# Patient Record
Sex: Male | Born: 1965 | Race: Black or African American | Hispanic: No | Marital: Single | Smoking: Never smoker
Health system: Southern US, Community
[De-identification: ages and names within clinical notes are randomized; demographics above are authoritative.]

## PROBLEM LIST (undated history)

## (undated) DIAGNOSIS — F5101 Primary insomnia: Secondary | ICD-10-CM

## (undated) DIAGNOSIS — I10 Essential (primary) hypertension: Secondary | ICD-10-CM

## (undated) DIAGNOSIS — E1029 Type 1 diabetes mellitus with other diabetic kidney complication: Secondary | ICD-10-CM

## (undated) DIAGNOSIS — N400 Enlarged prostate without lower urinary tract symptoms: Secondary | ICD-10-CM

## (undated) DIAGNOSIS — I771 Stricture of artery: Secondary | ICD-10-CM

## (undated) DIAGNOSIS — N529 Male erectile dysfunction, unspecified: Secondary | ICD-10-CM

## (undated) DIAGNOSIS — R801 Persistent proteinuria, unspecified: Secondary | ICD-10-CM

## (undated) DIAGNOSIS — G4700 Insomnia, unspecified: Secondary | ICD-10-CM

## (undated) DIAGNOSIS — E782 Mixed hyperlipidemia: Secondary | ICD-10-CM

## (undated) DIAGNOSIS — L989 Disorder of the skin and subcutaneous tissue, unspecified: Secondary | ICD-10-CM

## (undated) HISTORY — DX: Disorder of the skin and subcutaneous tissue, unspecified: L98.9

## (undated) HISTORY — DX: Benign prostatic hyperplasia without lower urinary tract symptoms: N40.0

## (undated) HISTORY — DX: Persistent proteinuria, unspecified: R80.1

## (undated) HISTORY — DX: Stricture of artery: I77.1

## (undated) HISTORY — DX: Essential (primary) hypertension: I10

## (undated) HISTORY — DX: Type 1 diabetes mellitus with other diabetic kidney complication: E10.29

## (undated) HISTORY — DX: Mixed hyperlipidemia: E78.2

## (undated) HISTORY — DX: Male erectile dysfunction, unspecified: N52.9

## (undated) HISTORY — DX: Insomnia, unspecified: G47.00

## (undated) HISTORY — DX: Primary insomnia: F51.01

## (undated) HISTORY — PX: NO PAST SURGERIES: SHX2092

---

## 2005-02-25 ENCOUNTER — Observation Stay (HOSPITAL_COMMUNITY): Admission: EM | Admit: 2005-02-25 | Discharge: 2005-02-26 | Payer: Self-pay | Admitting: Emergency Medicine

## 2010-04-19 ENCOUNTER — Ambulatory Visit: Payer: Self-pay | Admitting: Diagnostic Radiology

## 2010-04-19 ENCOUNTER — Emergency Department (HOSPITAL_BASED_OUTPATIENT_CLINIC_OR_DEPARTMENT_OTHER): Admission: EM | Admit: 2010-04-19 | Discharge: 2010-04-19 | Payer: Self-pay | Admitting: Emergency Medicine

## 2010-08-09 LAB — URINALYSIS, ROUTINE W REFLEX MICROSCOPIC
Glucose, UA: NEGATIVE mg/dL
Nitrite: NEGATIVE
Protein, ur: NEGATIVE mg/dL
Urobilinogen, UA: 0.2 mg/dL (ref 0.0–1.0)

## 2010-08-09 LAB — DIFFERENTIAL
Basophils Relative: 0 % (ref 0–1)
Monocytes Relative: 9 % (ref 3–12)
Neutro Abs: 2.3 10*3/uL (ref 1.7–7.7)
Neutrophils Relative %: 52 % (ref 43–77)

## 2010-08-09 LAB — CBC
HCT: 41.6 % (ref 39.0–52.0)
MCHC: 34.7 g/dL (ref 30.0–36.0)
MCV: 90.1 fL (ref 78.0–100.0)
RDW: 11.2 % — ABNORMAL LOW (ref 11.5–15.5)

## 2010-08-09 LAB — COMPREHENSIVE METABOLIC PANEL
Alkaline Phosphatase: 70 U/L (ref 39–117)
BUN: 15 mg/dL (ref 6–23)
CO2: 27 mEq/L (ref 19–32)
Calcium: 9.3 mg/dL (ref 8.4–10.5)
GFR calc non Af Amer: >60 mL/min (ref >60)
Glucose, Bld: 157 mg/dL — ABNORMAL HIGH (ref 70–99)
Total Protein: 7.2 g/dL (ref 6.0–8.3)

## 2010-08-09 LAB — LIPASE, BLOOD: Lipase: 163 U/L (ref 23–300)

## 2010-08-09 LAB — POCT CARDIAC MARKERS: Troponin i, poc: <0.05 ng/mL (ref 0.00–0.09)

## 2010-10-14 NOTE — Discharge Summary (Signed)
[name], [name]               ACCOUNT NO.:  [id number]   MEDICAL RECORD NO.:  [id number]          PATIENT TYPE:  OBV   LOCATION:  5507                         FACILITY:  [facility]   PHYSICIAN:  Corinna L. Lendell Caprice, MDDATE OF BIRTH:  09-22-1965   DATE OF ADMISSION:  02/25/2005  DATE OF DISCHARGE:                                 DISCHARGE SUMMARY   DIAGNOSES:  1.  Chest pain, most likely costochondritis.  2.  Abnormal EKG.  3.  Diabetes.  4.  Hyperlipidemia   DISCHARGE MEDICATIONS:  1.  Ibuprofen 400 mg p.o. q.4h. p.r.n. pain.  2.  Avandamet 4/500 1 p.o. b.i.d.  3.  Lipitor 40 mg a day.  4.  Flomax 0.4 mg a day.   ACTIVITY:  Ad lib.   CONDITION:  Stable.   FOLLOW UP:  [facility] for consideration of stress test.   PROCEDURES:  None.   CONSULTATIONS:  None.   PERTINENT LABORATORIES:  D-dimer was less than 0.22. CBC unremarkable. BMET  was significant for a glucose of 173, otherwise was unremarkable.  Point of  care enzymes negative. Total CPK was 328, MB fraction 2.2, index 0.7.  Troponins remained 0.02 and below.   EKG SPECIAL STUDIES AND RADIOLOGY:  Chest x-ray negative.  EKG in the walk-  in clinic revealed J-point elevation.  EKG of the emergency room revealed  lateral flipped T-waves which had not been present on the previous EKG.  Follow-up EKG in the morning showed resolution of the flipped T-waves.   HISTORY AND HOSPITAL COURSE:  [name] is a pleasant, 45 year old, black  male patient of Dr. Azucena Cecil who presented to the [facility] with  substernal chest pain, dyspnea on exertion and some heart fluttering.  He  has multiple cardiac risk factors.  Namely, diabetes, hyperlipidemia and  family history of early MI. Please see H&P for complete details. His vital  signs were normal on admission.  He did have some reproducible chest pain  with palpation.  However, as he had some EKG changes, I did place him on  observation in the telemetry unit where he  ruled out for myocardial  infarction.  Toradol promptly relieved the pain. Nitroglycerin did not  relieve the pain. He had also had a lot of recent travel to various states  and D-dimer was ordered but was negative making pulmonary  embolus unlikely. He remained in normal sinus rhythm and had no further  chest pain.  I do feel that this is musculoskeletal; however, given his risk  factors and EKG changes, I do recommend followup with cardiology as an  outpatient for an outpatient stress test.      Corinna L. Lendell Caprice, MD  Electronically Signed     CLS/MEDQ  D:  02/26/2005  T:  02/26/2005  Job:  [id number]   cc:   Tally Joe, M.D.  Fax: [phone]   [facility]

## 2010-10-14 NOTE — H&P (Signed)
NAMECALYN, [name]               ACCOUNT NO.:  [id number]   MEDICAL RECORD NO.:  [id number]          PATIENT TYPE:  EMS   LOCATION:  MAJO                         FACILITY:  [facility]   PHYSICIAN:  Corinna L. Lendell Caprice, MDDATE OF BIRTH:  20-May-1966   DATE OF ADMISSION:  02/25/2005  DATE OF DISCHARGE:                                HISTORY & PHYSICAL   CHIEF COMPLAINT:  Shortness of breath and chest pain.   HISTORY OF PRESENT ILLNESS:  [name] is a pleasant 45 year old black male  who has had chest pain on and off for the past five to six days. He says  exertion makes it worse. It is substernal. It is accompanied by shortness of  breath. It is worsened by deep inspiration. He has had no nausea, vomiting,  dizziness, or cough. He had continued to work up at the gym without  difficulty except that he did not do the elliptical machine as long as  usual. He has taken many recent plane trips, but for two hours at a time, no  longer. He denies any leg pain or swelling. He denies history of coronary  artery disease or blood clot. Resting makes the pain better. It does not  occur at night time. He received nitroglycerin here in the emergency room  without much relief.  His cardiac risk factors are positive family history  for early MI, hyperlipidemia, and diabetes.   PAST MEDICAL HISTORY:  1.  Diabetes.  2.  Hyperlipidemia.  3.  Urinary hesitancy for which he is currently on Flomax.   MEDICATIONS:  1.  Avandamet 4/500 mg p.o. b.i.d.  2.  Lipitor 40 mg daily.  3.  Flomax 0.4 mg daily.   ALLERGIES:  No known drug allergies.   SOCIAL HISTORY:  He is married. He does not smoke or drink.   FAMILY HISTORY:  His father had his heart attack in his late 38s or early  17s. His father also had hypertension, diabetes, and hyperlipidemia.   REVIEW OF SYSTEMS:  As above. Otherwise, negative.   PHYSICAL EXAMINATION:  VITAL SIGNS: Temperature 96.5, blood pressure 133/77,  pulse 80, respiratory  rate 18, oxygen saturation 99% on room air.  GENERAL: The patient is well-nourished, well-developed, and in no acute  distress.  HEENT: Normocephalic and atraumatic. Pupils equal, round, and reactive to  light. Sclera nonicteric. Moist mucous membranes.  NECK: Supple with no thyromegaly, no carotid bruits.  LUNGS: Clear to auscultation bilaterally, without rales, rhonchi, or  wheezes.  CARDIOVASCULAR: Regular rate and rhythm without murmurs, rubs, or gallops.  He does have a reproducible component of the chest pain with palpation.  ABDOMEN: Soft, nontender, nondistended.  GU/RECTAL: Deferred.  EXTREMITIES: No clubbing, cyanosis, or edema.  SKIN: No rash.  PSYCHIATRIC: Normal affect.   LABORATORY DATA:  His first set of point of care enzymes are normal with  creatinine of 1.3, glucose 173, BUN 16. Sodium 137, potassium 4.0, chloride  104. Hemoglobin and hematocrit are normal.   Chest x-ray negative. E[facility] done at [facility] shows  normal sinus rhythm with right atrial enlargement  and what looks like J-  point elevation in leads V2 through V4 and a small amount of ST depression  or flipped T wave in lead III. EKG done in the emergency room shows normal  sinus rhythm with flipped T waves laterally which was not present upon the  first EKG.   ASSESSMENT/PLAN:  1.  Chest pain: This seems to be more musculoskeletal. However, the patient      has significant cardiac risk factors and does have EKG changes.      Therefore, he will be placed on observation telemetry unit and have an      MI ruled out. Also given his recent travel and the somewhat worsening of      the pain with shortness of breath, I will also get a D-dimer to evaluate      for pulmonary embolus. If this is high he will need a CT of the chest. I      will continue his aspirin. I will try a dose of Toradol.  2.  Diabetes. I will continue his outpatient medications and monitor blood      glucose.  3.   Hyperlipidemia. Continue Lipitor.      Corinna L. Lendell Caprice, MD  Electronically Signed     CLS/MEDQ  D:  02/25/2005  T:  02/25/2005  Job:  [id number]   cc:   Tally Joe, M.D.  Fax: [phone]

## 2012-07-26 ENCOUNTER — Encounter: Payer: Self-pay | Admitting: Internal Medicine

## 2012-12-01 ENCOUNTER — Emergency Department (HOSPITAL_BASED_OUTPATIENT_CLINIC_OR_DEPARTMENT_OTHER)
Admission: EM | Admit: 2012-12-01 | Discharge: 2012-12-02 | Disposition: A | Discharge status: Home / Self Care | Payer: BC Managed Care – PPO | Attending: Emergency Medicine | Admitting: Emergency Medicine

## 2012-12-01 ENCOUNTER — Other Ambulatory Visit: Payer: Self-pay

## 2012-12-01 ENCOUNTER — Emergency Department (HOSPITAL_BASED_OUTPATIENT_CLINIC_OR_DEPARTMENT_OTHER): Payer: BC Managed Care – PPO

## 2012-12-01 ENCOUNTER — Encounter (HOSPITAL_BASED_OUTPATIENT_CLINIC_OR_DEPARTMENT_OTHER): Payer: Self-pay | Admitting: *Deleted

## 2012-12-01 DIAGNOSIS — R0789 Other chest pain: Secondary | ICD-10-CM

## 2012-12-01 DIAGNOSIS — Z794 Long term (current) use of insulin: Secondary | ICD-10-CM | POA: Insufficient documentation

## 2012-12-01 DIAGNOSIS — E119 Type 2 diabetes mellitus without complications: Secondary | ICD-10-CM | POA: Insufficient documentation

## 2012-12-01 DIAGNOSIS — Z79899 Other long term (current) drug therapy: Secondary | ICD-10-CM | POA: Insufficient documentation

## 2012-12-01 DIAGNOSIS — R071 Chest pain on breathing: Secondary | ICD-10-CM | POA: Insufficient documentation

## 2012-12-01 DIAGNOSIS — R110 Nausea: Secondary | ICD-10-CM | POA: Insufficient documentation

## 2012-12-01 DIAGNOSIS — Z7982 Long term (current) use of aspirin: Secondary | ICD-10-CM | POA: Insufficient documentation

## 2012-12-01 MED ORDER — NITROGLYCERIN 0.4 MG SL SUBL
0.4000 mg | SUBLINGUAL_TABLET | Freq: Once | SUBLINGUAL | Status: AC
  Administered 2012-12-01: 0.4 mg via SUBLINGUAL
  Filled 2012-12-01: qty 25

## 2012-12-01 NOTE — ED Notes (Signed)
Pt states he lifted weights on Friday. Yesterday began having left side CP. +nausea today. Describes as stabbing. EKG done at triage. Took Oxycodone 30 min ago.

## 2012-12-01 NOTE — ED Provider Notes (Signed)
History    CSN: [id number] Arrival date & time 12/01/12  2202  First MD Initiated Contact with Patient 12/01/12 2221     Chief Complaint  Patient presents with  . Chest Pain   (Consider location/radiation/quality/duration/timing/severity/associated sxs/prior Treatment) Patient is a 47 y.o. male presenting with chest pain. The history is provided by the patient. No language interpreter was used.  Chest Pain Pain location:  L chest Associated symptoms: nausea   Associated symptoms: no fever   Associated symptoms comment:  He reports left sided chest pain that started earlier today, associated with nausea. Pain has been fairly constant, some waxing and waning. Worse with certain movements but present at rest. He lifted weights a couple of days ago and felt this was cause of pain. No history of heart conditions.   Past Medical History  Diagnosis Date  . Diabetes mellitus without complication    History reviewed. No pertinent past surgical history. History reviewed. No pertinent family history. History  Substance Use Topics  . Smoking status: Never Smoker   . Smokeless tobacco: Not on file  . Alcohol Use: Yes    Review of Systems  Constitutional: Negative for fever and chills.  Respiratory: Negative.   Cardiovascular: Positive for chest pain. Negative for leg swelling.  Gastrointestinal: Positive for nausea.  Musculoskeletal: Negative.  Negative for myalgias.  Skin: Negative.   Neurological: Negative.     Allergies  Review of patient's allergies indicates no known allergies.  Home Medications   Current Outpatient Rx  Name  Route  Sig  Dispense  Refill  . aspirin 81 MG tablet   Oral   Take 81 mg by mouth daily.         . insulin glargine (LANTUS) 100 UNIT/ML injection   Subcutaneous   Inject 80 Units into the skin at bedtime.         . insulin lispro (HUMALOG) 100 UNIT/ML injection   Subcutaneous   Inject 45 Units into the skin 3 (three) times daily before  meals.         . ramipril (ALTACE) 2.5 MG capsule   Oral   Take 2.5 mg by mouth daily.          BP 154/89  Pulse 84  Temp(Src) 98.5 F (36.9 C) (Oral)  Resp 20  Ht 5\' 11"  (1.803 m)  Wt 207 lb (93.895 kg)  BMI 28.88 kg/m2  SpO2 98% Physical Exam  Constitutional: He appears well-developed and well-nourished.  HENT:  Head: Normocephalic.  Neck: Normal range of motion. Neck supple.  Cardiovascular: Normal rate and regular rhythm.   Pulmonary/Chest: Effort normal and breath sounds normal.  Abdominal: Soft. Bowel sounds are normal. There is no tenderness. There is no rebound and no guarding.  Musculoskeletal: Normal range of motion.  Neurological: He is alert. No cranial nerve deficit.  Skin: Skin is warm and dry. No rash noted.  Psychiatric: He has a normal mood and affect.    ED Course  Procedures (including critical care time) Labs Reviewed  CBC WITH DIFFERENTIAL  BASIC METABOLIC PANEL  TROPONIN I   Date: 12/01/2012  Rate: 83  Rhythm: normal sinus rhythm  QRS Axis: normal  Intervals: normal  ST/T Wave abnormalities: nonspecific T wave changes  Conduction Disutrbances:none  Narrative Interpretation:   Old EKG Reviewed: none available Results for orders placed during the hospital encounter of 12/01/12  CBC WITH DIFFERENTIAL      Result Value Range   WBC 7.0  4.0 - 10.5  K/uL   RBC 4.77  4.22 - 5.81 MIL/uL   Hemoglobin 15.1  13.0 - 17.0 g/dL   HCT 16.1  09.6 - 04.5 %   MCV 88.1  78.0 - 100.0 fL   MCH 31.7  26.0 - 34.0 pg   MCHC 36.0  30.0 - 36.0 g/dL   RDW 40.9  81.1 - 91.4 %   Platelets 236  150 - 400 K/uL   Neutrophils Relative % 44  43 - 77 %   Neutro Abs 3.1  1.7 - 7.7 K/uL   Lymphocytes Relative 43  12 - 46 %   Lymphs Abs 3.0  0.7 - 4.0 K/uL   Monocytes Relative 11  3 - 12 %   Monocytes Absolute 0.8  0.1 - 1.0 K/uL   Eosinophils Relative 2  0 - 5 %   Eosinophils Absolute 0.1  0.0 - 0.7 K/uL   Basophils Relative 0  0 - 1 %   Basophils Absolute 0.0   0.0 - 0.1 K/uL  BASIC METABOLIC PANEL      Result Value Range   Sodium 137  135 - 145 mEq/L   Potassium 3.7  3.5 - 5.1 mEq/L   Chloride 101  96 - 112 mEq/L   CO2 27  19 - 32 mEq/L   Glucose, Bld 192 (*) 70 - 99 mg/dL   BUN 24 (*) 6 - 23 mg/dL   Creatinine, Ser 7.82 (*) 0.50 - 1.35 mg/dL   Calcium 95.6  8.4 - 21.3 mg/dL   GFR calc non Af Amer 53 (*) >90 mL/min   GFR calc Af Amer 62 (*) >90 mL/min  TROPONIN I      Result Value Range   Troponin I <0.30  <0.30 ng/mL     Dg Chest 2 View  12/01/2012   *RADIOLOGY REPORT*  Clinical Data: Chest pain.  CHEST - 2 VIEW  Comparison: 04/19/2010  Findings: Heart is normal size.  Linear densities in the left base compatible with atelectasis.  Right lung is clear.  No effusions or bony abnormality.  IMPRESSION: Left base atelectasis.   Original Report Authenticated By: Charlett Nose, M.D.   No diagnosis found. 1. Chest pain MDM  Patient care turned over to Dr. Bernette Mayers pending labs.    Arnoldo Hooker, PA-C 12/09/12 [phone]

## 2012-12-02 LAB — CBC WITH DIFFERENTIAL/PLATELET
Basophils Absolute: 0 10*3/uL (ref 0.0–0.1)
Basophils Relative: 0 % (ref 0–1)
Eosinophils Absolute: 0.1 10*3/uL (ref 0.0–0.7)
Eosinophils Relative: 2 % (ref 0–5)
HCT: 42 % (ref 39.0–52.0)
Hemoglobin: 15.1 g/dL (ref 13.0–17.0)
MCH: 31.7 pg (ref 26.0–34.0)
MCHC: 36 g/dL (ref 30.0–36.0)
MCV: 88.1 fL (ref 78.0–100.0)
Monocytes Absolute: 0.8 10*3/uL (ref 0.1–1.0)
Monocytes Relative: 11 % (ref 3–12)
RDW: 11.7 % (ref 11.5–15.5)

## 2012-12-02 LAB — BASIC METABOLIC PANEL
BUN: 24 mg/dL — ABNORMAL HIGH (ref 6–23)
Calcium: 10.3 mg/dL (ref 8.4–10.5)
Creatinine, Ser: 1.5 mg/dL — ABNORMAL HIGH (ref 0.50–1.35)
GFR calc Af Amer: 62 mL/min — ABNORMAL LOW (ref >90)

## 2012-12-02 LAB — TROPONIN I: Troponin I: <0.3 ng/mL (ref <0.30)

## 2012-12-02 MED ORDER — IBUPROFEN 800 MG PO TABS: 800.0000 mg | ORAL_TABLET | Freq: Three times a day (TID) | ORAL | Status: DC

## 2012-12-02 MED ORDER — CYCLOBENZAPRINE HCL 10 MG PO TABS: 10.0000 mg | ORAL_TABLET | Freq: Three times a day (TID) | ORAL | Status: DC | PRN

## 2012-12-02 MED ORDER — KETOROLAC TROMETHAMINE 30 MG/ML IJ SOLN
30.0000 mg | Freq: Once | INTRAMUSCULAR | Status: AC
  Administered 2012-12-02: 30 mg via INTRAVENOUS
  Filled 2012-12-02: qty 1

## 2012-12-02 NOTE — ED Provider Notes (Signed)
Care assumed at the change of shift. Medical screening examination/treatment/procedure(s) were conducted as a shared visit with non-physician practitioner(s) and myself.  I personally evaluated the patient during the encounter  Pt with reproducible MSK chest pain after heavy lifting, ongoing for >24hrs with negative Trop and no ischemic changes on EKG. Doubt this is cardiac or pulmonary etiology. Advised to rest and avoid heavy lifting for a few days. PCP followup. Advised to return to the ED for any worsening pain, SOB or for any other concerns.   Charles B. Bernette Mayers, MD 12/02/12 1610

## 2012-12-09 NOTE — ED Provider Notes (Signed)
Medical screening examination/treatment/procedure(s) were performed by non-physician practitioner and as supervising physician I was immediately available for consultation/collaboration.  Geoffery Lyons, MD 12/09/12 [phone]

## 2015-06-02 ENCOUNTER — Encounter (HOSPITAL_BASED_OUTPATIENT_CLINIC_OR_DEPARTMENT_OTHER): Payer: Self-pay | Admitting: Emergency Medicine

## 2015-06-02 ENCOUNTER — Emergency Department (HOSPITAL_BASED_OUTPATIENT_CLINIC_OR_DEPARTMENT_OTHER)
Admission: EM | Admit: 2015-06-02 | Discharge: 2015-06-02 | Disposition: A | Discharge status: Home / Self Care | Payer: BLUE CROSS/BLUE SHIELD | Attending: Emergency Medicine | Admitting: Emergency Medicine

## 2015-06-02 ENCOUNTER — Emergency Department (HOSPITAL_BASED_OUTPATIENT_CLINIC_OR_DEPARTMENT_OTHER): Payer: BLUE CROSS/BLUE SHIELD

## 2015-06-02 DIAGNOSIS — Z7982 Long term (current) use of aspirin: Secondary | ICD-10-CM | POA: Diagnosis not present

## 2015-06-02 DIAGNOSIS — R5383 Other fatigue: Secondary | ICD-10-CM | POA: Diagnosis not present

## 2015-06-02 DIAGNOSIS — E119 Type 2 diabetes mellitus without complications: Secondary | ICD-10-CM | POA: Insufficient documentation

## 2015-06-02 DIAGNOSIS — Z794 Long term (current) use of insulin: Secondary | ICD-10-CM | POA: Diagnosis not present

## 2015-06-02 DIAGNOSIS — R0602 Shortness of breath: Secondary | ICD-10-CM | POA: Insufficient documentation

## 2015-06-02 DIAGNOSIS — R079 Chest pain, unspecified: Secondary | ICD-10-CM

## 2015-06-02 LAB — CBC
HEMATOCRIT: 46.4 % (ref 39.0–52.0)
HEMOGLOBIN: 16.3 g/dL (ref 13.0–17.0)
MCH: 30.9 pg (ref 26.0–34.0)
MCHC: 35.1 g/dL (ref 30.0–36.0)
MCV: 87.9 fL (ref 78.0–100.0)
Platelets: 258 10*3/uL (ref 150–400)
RBC: 5.28 MIL/uL (ref 4.22–5.81)
RDW: 11.2 % — ABNORMAL LOW (ref 11.5–15.5)
WBC: 5.3 10*3/uL (ref 4.0–10.5)

## 2015-06-02 LAB — TROPONIN I
Troponin I: <0.03 ng/mL (ref <0.031)
Troponin I: <0.03 ng/mL (ref <0.031)

## 2015-06-02 LAB — BASIC METABOLIC PANEL
ANION GAP: 7 (ref 5–15)
BUN: 25 mg/dL — ABNORMAL HIGH (ref 6–20)
CALCIUM: 9.4 mg/dL (ref 8.9–10.3)
CO2: 28 mmol/L (ref 22–32)
Chloride: 101 mmol/L (ref 101–111)
Creatinine, Ser: 1.37 mg/dL — ABNORMAL HIGH (ref 0.61–1.24)
GFR, EST NON AFRICAN AMERICAN: 59 mL/min — AB (ref >60)
Glucose, Bld: 168 mg/dL — ABNORMAL HIGH (ref 65–99)
POTASSIUM: 4.1 mmol/L (ref 3.5–5.1)
SODIUM: 136 mmol/L (ref 135–145)

## 2015-06-02 LAB — D-DIMER, QUANTITATIVE: D-Dimer, Quant: 0.29 ug/mL-FEU (ref 0.00–0.50)

## 2015-06-02 NOTE — Discharge Instructions (Signed)
Nonspecific Chest Pain  °Chest pain can be caused by many different conditions. There is always a chance that your pain could be related to something serious, such as a heart attack or a blood clot in your lungs. Chest pain can also be caused by conditions that are not life-threatening. If you have chest pain, it is very important to follow up with your health care provider. °CAUSES  °Chest pain can be caused by: °· Heartburn. °· Pneumonia or bronchitis. °· Anxiety or stress. °· Inflammation around your heart (pericarditis) or lung (pleuritis or pleurisy). °· A blood clot in your lung. °· A collapsed lung (pneumothorax). It can develop suddenly on its own (spontaneous pneumothorax) or from trauma to the chest. °· Shingles infection (varicella-zoster virus). °· Heart attack. °· Damage to the bones, muscles, and cartilage that make up your chest wall. This can include: °¨ Bruised bones due to injury. °¨ Strained muscles or cartilage due to frequent or repeated coughing or overwork. °¨ Fracture to one or more ribs. °¨ Sore cartilage due to inflammation (costochondritis). °RISK FACTORS  °Risk factors for chest pain may include: °· Activities that increase your risk for trauma or injury to your chest. °· Respiratory infections or conditions that cause frequent coughing. °· Medical conditions or overeating that can cause heartburn. °· Heart disease or family history of heart disease. °· Conditions or health behaviors that increase your risk of developing a blood clot. °· Having had chicken pox (varicella zoster). °SIGNS AND SYMPTOMS °Chest pain can feel like: °· Burning or tingling on the surface of your chest or deep in your chest. °· Crushing, pressure, aching, or squeezing pain. °· Dull or sharp pain that is worse when you move, cough, or take a deep breath. °· Pain that is also felt in your back, neck, shoulder, or arm, or pain that spreads to any of these areas. °Your chest pain may come and go, or it may stay  constant. °DIAGNOSIS °Lab tests or other studies may be needed to find the cause of your pain. Your health care provider may have you take a test called an ambulatory ECG (electrocardiogram). An ECG records your heartbeat patterns at the time the test is performed. You may also have other tests, such as: °· Transthoracic echocardiogram (TTE). During echocardiography, sound waves are used to create a picture of all of the heart structures and to look at how blood flows through your heart. °· Transesophageal echocardiogram (TEE). This is a more advanced imaging test that obtains images from inside your body. It allows your health care provider to see your heart in finer detail. °· Cardiac monitoring. This allows your health care provider to monitor your heart rate and rhythm in real time. °· Holter monitor. This is a portable device that records your heartbeat and can help to diagnose abnormal heartbeats. It allows your health care provider to track your heart activity for several days, if needed. °· Stress tests. These can be done through exercise or by taking medicine that makes your heart beat more quickly. °· Blood tests. °· Imaging tests. °TREATMENT  °Your treatment depends on what is causing your chest pain. Treatment may include: °· Medicines. These may include: °¨ Acid blockers for heartburn. °¨ Anti-inflammatory medicine. °¨ Pain medicine for inflammatory conditions. °¨ Antibiotic medicine, if an infection is present. °¨ Medicines to dissolve blood clots. °¨ Medicines to treat coronary artery disease. °· Supportive care for conditions that do not require medicines. This may include: °¨ Resting. °¨ Applying heat   or cold packs to injured areas. °¨ Limiting activities until pain decreases. °HOME CARE INSTRUCTIONS °· If you were prescribed an antibiotic medicine, finish it all even if you start to feel better. °· Avoid any activities that bring on chest pain. °· Do not use any tobacco products, including  cigarettes, chewing tobacco, or electronic cigarettes. If you need help quitting, ask your health care provider. °· Do not drink alcohol. °· Take medicines only as directed by your health care provider. °· Keep all follow-up visits as directed by your health care provider. This is important. This includes any further testing if your chest pain does not go away. °· If heartburn is the cause for your chest pain, you may be told to keep your head raised (elevated) while sleeping. This reduces the chance that acid will go from your stomach into your esophagus. °· Make lifestyle changes as directed by your health care provider. These may include: °¨ Getting regular exercise. Ask your health care provider to suggest some activities that are safe for you. °¨ Eating a heart-healthy diet. A registered dietitian can help you to learn healthy eating options. °¨ Maintaining a healthy weight. °¨ Managing diabetes, if necessary. °¨ Reducing stress. °SEEK MEDICAL CARE IF: °· Your chest pain does not go away after treatment. °· You have a rash with blisters on your chest. °· You have a fever. °SEEK IMMEDIATE MEDICAL CARE IF:  °· Your chest pain is worse. °· You have an increasing cough, or you cough up blood. °· You have severe abdominal pain. °· You have severe weakness. °· You faint. °· You have chills. °· You have sudden, unexplained chest discomfort. °· You have sudden, unexplained discomfort in your arms, back, neck, or jaw. °· You have shortness of breath at any time. °· You suddenly start to sweat, or your skin gets clammy. °· You feel nauseous or you vomit. °· You suddenly feel light-headed or dizzy. °· Your heart begins to beat quickly, or it feels like it is skipping beats. °These symptoms may represent a serious problem that is an emergency. Do not wait to see if the symptoms will go away. Get medical help right away. Call your local emergency services (911 in the [place] Do not drive yourself to the hospital. °  °This  information is not intended to replace advice given to you by your health care provider. Make sure you discuss any questions you have with your health care provider. °  °Document Released: 02/22/2005 Document Revised: 06/05/2014 Document Reviewed: 12/19/2013 °Elsevier Interactive Patient Education ©2016 [employer] ° °

## 2015-06-02 NOTE — ED Notes (Signed)
Patient reports that he has been fatigued and having sharp intermittent pains to his left chest over the last 2 -3 days. The patient reports that his chest has been more and more "heavy" today

## 2015-06-02 NOTE — ED Provider Notes (Signed)
CSN: [id number]     Arrival date & time 06/02/15  1720 History  By signing my name below, I, Jarvis Morganaylor Ferguson, attest that this documentation has been prepared under the direction and in the presence of No att. providers found. Electronically Signed: Jarvis Morganaylor Ferguson, ED Scribe. 06/03/2015. 12:35 AM.    Chief Complaint  Patient presents with  . Chest Pain    The history is provided by the patient. No language interpreter was used.    HPI Comments: Rodney BoozeDonovan Eastham is a 50 y.o. male with a h/o DM who presents to the Emergency Department complaining of sudden onset, intermittent, moderate, sharp and pressured, non-exertional, left sided chest pain for 2-3 days. He notes associated mild SOB and fatigue.  Pt endorses he travels a lot for work. He has not taken any medications prior to arrival. Pt notes that his BS levels have been over 300. He reports that early onset cardiac disease runs in his family. He had a stress test 10 years ago which came back normal. Pt notes he has a h/o costochondritis but this feels different. Pt is a non smoker. He denies any leg swelling, cough, wheezing, or other respiratory symptoms.  Past Medical History  Diagnosis Date  . Diabetes mellitus without complication (HCC)    History reviewed. No pertinent past surgical history. History reviewed. No pertinent family history. Social History  Substance Use Topics  . Smoking status: Never Smoker   . Smokeless tobacco: None  . Alcohol Use: Yes    Review of Systems  Constitutional: Positive for fatigue.  Respiratory: Positive for shortness of breath. Negative for cough, chest tightness and wheezing.   Cardiovascular: Positive for chest pain. Negative for leg swelling.      Allergies  Review of patient's allergies indicates no known allergies.  Home Medications   Prior to Admission medications   Medication Sig Start Date End Date Taking? Authorizing Provider  aspirin 81 MG tablet Take 81 mg by mouth daily.     Historical Provider, MD  cyclobenzaprine (FLEXERIL) 10 MG tablet Take 1 tablet (10 mg total) by mouth 3 (three) times daily as needed for muscle spasms. 12/02/12   Susy Frizzleharles [place], MD  ibuprofen (ADVIL,MOTRIN) 800 MG tablet Take 1 tablet (800 mg total) by mouth 3 (three) times daily. 12/02/12   Susy Frizzleharles [place], MD  insulin glargine (LANTUS) 100 UNIT/ML injection Inject 80 Units into the skin at bedtime.    Historical Provider, MD  insulin lispro (HUMALOG) 100 UNIT/ML injection Inject 45 Units into the skin 3 (three) times daily before meals.    Historical Provider, MD  ramipril (ALTACE) 2.5 MG capsule Take 2.5 mg by mouth daily.    Historical Provider, MD   Triage Vitals: BP 161/99 mmHg  Pulse 69  Temp(Src) 98.1 F (36.7 C) (Oral)  Resp 16  Ht 5\' 11"  (1.803 m)  Wt 210 lb (95.255 kg)  BMI 29.30 kg/m2  SpO2 98%  Physical Exam  Constitutional: He is oriented to person, place, and time. He appears well-developed and well-nourished. No distress.  HENT:  Head: Normocephalic and atraumatic.  Eyes: Conjunctivae and EOM are normal.  Neck: Neck supple. No tracheal deviation present.  Cardiovascular: Normal rate, regular rhythm and normal heart sounds.   No murmur heard. No lower extremity edema  Pulmonary/Chest: Effort normal and breath sounds normal. No respiratory distress. He has no wheezes.  Left parasternal tenderness  Musculoskeletal: Normal range of motion.  Neurological: He is alert and oriented to person, place, and time.  Skin: Skin is warm and dry. No rash noted.  Psychiatric: He has a normal mood and affect. His behavior is normal.  Nursing note and vitals reviewed.   ED Course  Procedures (including critical care time)   Results for orders placed or performed during the hospital encounter of 06/02/15  Basic metabolic panel  Result Value Ref Range   Sodium 136 135 - 145 mmol/L   Potassium 4.1 3.5 - 5.1 mmol/L   Chloride 101 101 - 111 mmol/L   CO2 28 22 - 32 mmol/L    Glucose, Bld 168 (H) 65 - 99 mg/dL   BUN 25 (H) 6 - 20 mg/dL   Creatinine, Ser 8.11 (H) 0.61 - 1.24 mg/dL   Calcium 9.4 8.9 - 91.4 mg/dL   GFR calc non Af Amer 59 (L) >60 mL/min   GFR calc Af Amer >60 >60 mL/min   Anion gap 7 5 - 15  CBC  Result Value Ref Range   WBC 5.3 4.0 - 10.5 K/uL   RBC 5.28 4.22 - 5.81 MIL/uL   Hemoglobin 16.3 13.0 - 17.0 g/dL   HCT 78.2 95.6 - 21.3 %   MCV 87.9 78.0 - 100.0 fL   MCH 30.9 26.0 - 34.0 pg   MCHC 35.1 30.0 - 36.0 g/dL   RDW 08.6 (L) 57.8 - 46.9 %   Platelets 258 150 - 400 K/uL  Troponin I  Result Value Ref Range   Troponin I <0.03 <0.031 ng/mL  D-dimer, quantitative (not at [facility])  Result Value Ref Range   D-Dimer, Quant 0.29 0.00 - 0.50 ug/mL-FEU  Troponin I  Result Value Ref Range   Troponin I <0.03 <0.031 ng/mL   Dg Chest 2 View  06/02/2015  CLINICAL DATA:  Chest tightness and some shortness of breath for 3 days. EXAM: CHEST  2 VIEW COMPARISON:  None. FINDINGS: Normal mediastinum and cardiac silhouette. Normal pulmonary vasculature. No evidence of effusion, infiltrate, or pneumothorax. No acute bony abnormality. IMPRESSION: No acute cardiopulmonary process. Electronically Signed   By: Genevive Bi M.D.   On: 06/02/2015 17:54   I have personally reviewed and evaluated these images and lab results as part of my medical decision-making.   EKG Interpretation   Date/Time:  Wednesday June 02 2015 17:24:27 EST Ventricular Rate:  75 PR Interval:  128 QRS Duration: 84 QT Interval:  372 QTC Calculation: 415 R Axis:   49 Text Interpretation:  Normal sinus rhythm Biatrial enlargement Nonspecific  T wave abnormality Abnormal ECG nonspecific inferior ST  changes Confirmed  by Rubin Payor  MD, Harrold Donath [phone]) on 06/02/2015 6:27:48 PM      MDM   Final diagnoses:  Chest pain, unspecified chest pain type    Patient with chest pain. EKG reassuring. D-dimer and troponin negative. Nonspecific EKG changes. Will discharge home. Will follow-up  for further outpatient workup. I personally performed the services described in this documentation, which was scribed in my presence. The recorded information has been reviewed and is accurate.       Benjiman Core, MD 06/03/15 [phone]

## 2015-06-02 NOTE — ED Notes (Signed)
MD at bedside discussing test results and dispo plan of care. 

## 2015-07-05 DIAGNOSIS — E782 Mixed hyperlipidemia: Secondary | ICD-10-CM | POA: Insufficient documentation

## 2015-07-05 DIAGNOSIS — R801 Persistent proteinuria, unspecified: Secondary | ICD-10-CM | POA: Insufficient documentation

## 2015-07-05 DIAGNOSIS — I10 Essential (primary) hypertension: Secondary | ICD-10-CM | POA: Insufficient documentation

## 2015-07-05 DIAGNOSIS — E1029 Type 1 diabetes mellitus with other diabetic kidney complication: Secondary | ICD-10-CM | POA: Insufficient documentation

## 2015-07-05 DIAGNOSIS — F5101 Primary insomnia: Secondary | ICD-10-CM | POA: Insufficient documentation

## 2015-07-05 DIAGNOSIS — N400 Enlarged prostate without lower urinary tract symptoms: Secondary | ICD-10-CM | POA: Insufficient documentation

## 2015-07-05 DIAGNOSIS — N529 Male erectile dysfunction, unspecified: Secondary | ICD-10-CM | POA: Insufficient documentation

## 2015-07-07 ENCOUNTER — Ambulatory Visit: Payer: BLUE CROSS/BLUE SHIELD | Admitting: Cardiovascular Disease

## 2015-07-13 ENCOUNTER — Encounter: Payer: Self-pay | Admitting: Cardiovascular Disease

## 2015-07-13 ENCOUNTER — Ambulatory Visit (INDEPENDENT_AMBULATORY_CARE_PROVIDER_SITE_OTHER): Payer: BLUE CROSS/BLUE SHIELD | Admitting: Cardiovascular Disease

## 2015-07-13 VITALS — BP 134/88 | HR 63 | Ht 71.0 in | Wt 209.8 lb

## 2015-07-13 DIAGNOSIS — I10 Essential (primary) hypertension: Secondary | ICD-10-CM

## 2015-07-13 DIAGNOSIS — R0789 Other chest pain: Secondary | ICD-10-CM

## 2015-07-13 DIAGNOSIS — E785 Hyperlipidemia, unspecified: Secondary | ICD-10-CM | POA: Diagnosis not present

## 2015-07-13 NOTE — Patient Instructions (Signed)
Medication Instructions:  Your physician recommends that you continue on your current medications as directed. Please refer to the Current Medication list given to you today.   Labwork: None Ordered   Testing/Procedures: Your physician has requested that you have a lexiscan myoview. For further information please visit [email or web address]. Please follow instruction sheet, as given.   Follow-Up: Your physician recommends that you schedule a follow-up appointment in: as needed with Dr. Nahser.    If you need a refill on your cardiac medications before your next appointment, please call your pharmacy.   Thank you for choosing [facility]! Michelle Swinyer, RN [phone]   

## 2015-07-13 NOTE — Progress Notes (Signed)
Cardiology Office Note   Date:  07/13/2015   [name], DOB [birth year], MRN [id number]  PCP:  Brian Blamer, MD  Cardiologist:   Brian Mixer, MD   Chief Complaint  Patient presents with  . Chest Pain   Problem  1. Hyperlipidemia 2. Diabetes mellitus   History of Present Illness: [name] is a 50 y.o. male who presents for  Chest pain    3 months ago , he noticed shooting pains in his chest  He has some chest pain , Went to the ER ,  Work up was negative at that time Now, has CP  on a daily basis - several times a day Not Associated with dyspnea or sweats. Not brought on by exertion, taking a deep breath,  Nor change of position  Thinks that his sleeping position may affect this on occasion  These CP last for a few minutes. He describes this as a heaviness  + family hx of CAD - father and grandfather    Recently has been improving his diet.  Is restricting his carbs some now.    Was lifting weights regularly,  Walks on occasion. Stopped these exercises when the pain started.   Works as a Engineer, building services .    Past Medical History  Diagnosis Date  . 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 Mixed hyperlipidemia   . ED (erectile dysfunction)     DUE TO ARTERIAL INSUFFICIENCY  . Arterial insufficiency (HCC)   . Enlarged prostate without lower urinary tract symptoms (luts)   . Persistent proteinuria   . Primary insomnia   . HTN (hypertension)   . Insomnia   . Skin lesion     Past Surgical History  Procedure Laterality Date  . No past surgeries       Current Outpatient Prescriptions  Medication Sig Dispense Refill  . aspirin 81 MG tablet Take 81 mg by mouth daily.    Marland Kitchen BAYER CONTOUR NEXT TEST test strip as directed.  0  . insulin lispro (HUMALOG) 100 UNIT/ML injection Inject 45 Units into the skin 3 (three) times daily before meals.    Marland Kitchen LEVEMIR FLEXTOUCH 100  UNIT/ML Pen Inject 52 Units as directed daily.  10  . NOVOLOG FLEXPEN 100 UNIT/ML FlexPen Inject 20 Units as directed daily.  11  . ramipril (ALTACE) 2.5 MG capsule Take 2.5 mg by mouth daily.    Marland Kitchen zolpidem (AMBIEN) 10 MG tablet Take 10 mg by mouth at bedtime as needed for sleep. Reported on 07/13/2015    . ibuprofen (ADVIL,MOTRIN) 800 MG tablet Take 1 tablet (800 mg total) by mouth 3 (three) times daily. (Patient not taking: Reported on 07/13/2015) 21 tablet 0  . sildenafil (VIAGRA) 100 MG tablet Take 100 mg by mouth daily as needed for erectile dysfunction. Reported on 07/13/2015     No current facility-administered medications for this visit.    Allergies:   Review of patient's allergies indicates no known allergies.    Social History:  The patient  reports that he has never smoked. He does not have any smokeless tobacco history on file. He reports that he drinks alcohol. He reports that he does not use illicit drugs.   Family History:  The patient's family history includes Breast cancer in his mother; Diabetes in his maternal grandmother and paternal grandmother; Heart attack in his father; Heart disease in his maternal grandfather; Hypertension in  his sister, sister, and sister; Stroke in his maternal grandmother and paternal grandmother.    ROS:  Please see the history of present illness.    Review of Systems: Constitutional:  denies fever, chills, diaphoresis, appetite change and fatigue.  HEENT: denies photophobia, eye pain, redness, hearing loss, ear pain, congestion, sore throat, rhinorrhea, sneezing, neck pain, neck stiffness and tinnitus.  Respiratory: denies SOB, DOE, cough, chest tightness, and wheezing.  Cardiovascular: admits to chest pain,   Gastrointestinal: denies nausea, vomiting, abdominal pain, diarrhea, constipation, blood in stool.  Genitourinary: denies dysuria, urgency, frequency, hematuria, flank pain and difficulty urinating.  Musculoskeletal: denies  myalgias,  back pain, joint swelling, arthralgias and gait problem.   Skin: denies pallor, rash and wound.  Neurological: denies dizziness, seizures, syncope, weakness, light-headedness, numbness and headaches.   Hematological: denies adenopathy, easy bruising, personal or family bleeding history.  Psychiatric/ Behavioral: denies suicidal ideation, mood changes, confusion, nervousness, sleep disturbance and agitation.       All other systems are reviewed and negative.    PHYSICAL EXAM: VS:  BP 134/88 mmHg  Pulse 63  Ht  (1.803 m)  Wt 209 lb 12.8 oz (95.165 kg)  BMI 29.27 kg/m2 , BMI Body mass index is 29.27 kg/(m^2). GEN: Well nourished, well developed, in no acute distress HEENT: normal Neck: no JVD, carotid bruits, or masses Cardiac: RRR; no murmurs, rubs, or gallops,no edema  Respiratory:  clear to auscultation bilaterally, normal work of breathing GI: soft, nontender, nondistended, + BS MS: no deformity or atrophy Skin: warm and dry, no rash Neuro:  Strength and sensation are intact Psych: normal   EKG:  EKG is ordered today. The ekg ordered today demonstrates  NSR at 63.   Possible LAE, NS ST abn.    Recent Labs: 06/02/2015: BUN 25*; Creatinine, Ser 1.37*; Hemoglobin 16.3; Platelets 258; Potassium 4.1; Sodium 136    Lipid Panel No results found for: CHOL, TRIG, HDL, CHOLHDL, VLDL, LDLCALC, LDLDIRECT    Wt Readings from Last 3 Encounters:  07/13/15 209 lb 12.8 oz (95.165 kg)  06/02/15 210 lb (95.255 kg)  12/01/12 207 lb (93.895 kg)      Other studies Reviewed: Additional studies/ records that were reviewed today include: . Review of the above records demonstrates:    ASSESSMENT AND PLAN:  1.  Chest tightness: [name] presents with episodes of chest tightness. His description has some atypical but also typical features for angina. He has several risk factors for coronary artery disease including a positive family history, poorly controlled diabetes, hyperlipidemia.  He also has had some elevated blood pressure readings although he does not necessarily carried the diagnosis of hypertension.  Think that we should proceed with a Lexiscan Myoview study. I'll see him on an as-needed basis unless the Myoview study is abnormal.   Current medicines are reviewed at length with the patient today.  The patient does not have concerns regarding medicines.  The following changes have been made:  no change  Labs/ tests ordered today include:  No orders of the defined types were placed in this encounter.     Disposition:   FU with  Me if the myoview is abnormal.     Rohil Lesch, Deloris Ping, MD  07/13/2015 9:37 AM    [facility] [address] [place], [place], [place]  [address] Phone: [phone]; Fax: [phone]   [facility]  [address] [place], [place]  [address] [phone]   Fax [phone])  [phone]    

## 2015-07-19 ENCOUNTER — Telehealth (HOSPITAL_COMMUNITY): Payer: Self-pay

## 2015-07-19 NOTE — Telephone Encounter (Signed)
Encounter complete. 

## 2015-07-21 ENCOUNTER — Ambulatory Visit (HOSPITAL_COMMUNITY): Payer: BLUE CROSS/BLUE SHIELD | Attending: Cardiology

## 2015-07-21 DIAGNOSIS — I10 Essential (primary) hypertension: Secondary | ICD-10-CM | POA: Insufficient documentation

## 2015-07-21 DIAGNOSIS — R0789 Other chest pain: Secondary | ICD-10-CM | POA: Diagnosis not present

## 2015-07-21 DIAGNOSIS — E119 Type 2 diabetes mellitus without complications: Secondary | ICD-10-CM | POA: Diagnosis not present

## 2015-07-21 LAB — MYOCARDIAL PERFUSION IMAGING
CHL CUP NUCLEAR SSS: 2
CSEPPHR: 95 {beats}/min
LHR: 0.27
LV dias vol: 105 mL
LVSYSVOL: 59 mL
NUC STRESS TID: 1.09
Rest HR: 68 {beats}/min
SDS: 1
SRS: 1

## 2015-07-21 MED ORDER — REGADENOSON 0.4 MG/5ML IV SOLN
0.4000 mg | Freq: Once | INTRAVENOUS | Status: AC
  Administered 2015-07-21: 0.4 mg via INTRAVENOUS

## 2015-07-21 MED ORDER — TECHNETIUM TC 99M SESTAMIBI GENERIC - CARDIOLITE
32.9000 | Freq: Once | INTRAVENOUS | Status: AC | PRN
  Administered 2015-07-21: 32.9 via INTRAVENOUS

## 2015-07-21 MED ORDER — TECHNETIUM TC 99M SESTAMIBI GENERIC - CARDIOLITE
10.2000 | Freq: Once | INTRAVENOUS | Status: AC | PRN
  Administered 2015-07-21: 10 via INTRAVENOUS

## 2015-07-22 ENCOUNTER — Telehealth: Payer: Self-pay | Admitting: Nurse Practitioner

## 2015-07-22 DIAGNOSIS — I5021 Acute systolic (congestive) heart failure: Secondary | ICD-10-CM

## 2015-07-22 MED ORDER — RAMIPRIL 5 MG PO CAPS: 5.0000 mg | ORAL_CAPSULE | Freq: Every day | ORAL | Status: DC

## 2015-07-22 NOTE — Telephone Encounter (Signed)
Spoke with patient and reviewed results of myocardial perfusion study.  Patient verbalized understanding and agreement to increase Altace to 5 mg daily.  He is scheduled for repeat bmet on 3/16 and office visit with Dr. Elease Hashimoto on 3/30.  I advised him that he may continue to exercise as tolerated and to call back with questions or concerns.  He thanked me for the call.

## 2015-07-22 NOTE — Telephone Encounter (Signed)
-----   Message from Vesta Mixer, MD sent at 07/21/2015  4:45 PM EST ----- No ischemia The LV function is moderately depressed.    Lets increase the altace to 5 mg a day , Bmp in 3 weeks .   I anticipate increasing it further . He should have an appt to see me in the next month or so

## 2015-08-12 ENCOUNTER — Other Ambulatory Visit (INDEPENDENT_AMBULATORY_CARE_PROVIDER_SITE_OTHER): Payer: BLUE CROSS/BLUE SHIELD | Admitting: *Deleted

## 2015-08-12 DIAGNOSIS — I5021 Acute systolic (congestive) heart failure: Secondary | ICD-10-CM

## 2015-08-12 LAB — BASIC METABOLIC PANEL
BUN: 14 mg/dL (ref 7–25)
CALCIUM: 9.8 mg/dL (ref 8.6–10.3)
CO2: 29 mmol/L (ref 20–31)
CREATININE: 1.36 mg/dL — AB (ref 0.70–1.33)
Chloride: 101 mmol/L (ref 98–110)
GLUCOSE: 275 mg/dL — AB (ref 65–99)
Potassium: 4.6 mmol/L (ref 3.5–5.3)
SODIUM: 139 mmol/L (ref 135–146)

## 2015-08-16 ENCOUNTER — Telehealth: Payer: Self-pay | Admitting: Cardiovascular Disease

## 2015-08-16 NOTE — Telephone Encounter (Signed)
New message ° ° ° ° °Returning a call to the nurse °

## 2015-08-16 NOTE — Telephone Encounter (Signed)
Spoke with patient and reviewed results of bmet.  He asked questions about his recent myocardial perfusion study and about ejection fraction which I answered to his satisfaction.  He is aware of follow-up appointment with Dr. Elease HashimotoNahser on 3/30.  He thanked me for the call.

## 2015-08-26 ENCOUNTER — Ambulatory Visit (INDEPENDENT_AMBULATORY_CARE_PROVIDER_SITE_OTHER): Payer: BLUE CROSS/BLUE SHIELD | Admitting: Cardiovascular Disease

## 2015-08-26 ENCOUNTER — Encounter: Payer: Self-pay | Admitting: Cardiovascular Disease

## 2015-08-26 VITALS — BP 124/88 | HR 82 | Ht 71.0 in | Wt 209.6 lb

## 2015-08-26 DIAGNOSIS — I5022 Chronic systolic (congestive) heart failure: Secondary | ICD-10-CM

## 2015-08-26 LAB — BASIC METABOLIC PANEL
BUN: 17 mg/dL (ref 7–25)
CO2: 30 mmol/L (ref 20–31)
CREATININE: 1.33 mg/dL (ref 0.70–1.33)
Calcium: 9.8 mg/dL (ref 8.6–10.3)
Chloride: 101 mmol/L (ref 98–110)
GLUCOSE: 196 mg/dL — AB (ref 65–99)
Potassium: 4.2 mmol/L (ref 3.5–5.3)
Sodium: 137 mmol/L (ref 135–146)

## 2015-08-26 MED ORDER — CARVEDILOL 6.25 MG PO TABS: 6.2500 mg | ORAL_TABLET | Freq: Two times a day (BID) | ORAL | Status: DC

## 2015-08-26 NOTE — Progress Notes (Signed)
Cardiology Office Note   Date:  08/26/2015   ID:  Brian BoozeDonovan Marsh, DOB Mar 29, 1966, MRN [id number]  PCP:  Johny BlamerHARRIS, WILLIAM, MD  Cardiologist:   Vesta MixerNahser, Philip J, MD   Chief Complaint  Patient presents with  . Hypertension   Problem  1. Hyperlipidemia 2. Diabetes mellitus   History of Present Illness: Brian BoozeDonovan Raus is a [age] y.o. male who presents for  Chest pain    3 months ago , he noticed shooting pains in his chest  He has some chest pain , Went to the ER ,  Work up was negative at that time Now, has CP  on a daily basis - several times a day Not Associated with dyspnea or sweats. Not brought on by exertion, taking a deep breath,  Nor change of position  Thinks that his sleeping position may affect this on occasion  These CP last for a few minutes. He describes this as a heaviness  + family hx of CAD - father and grandfather    Recently has been improving his diet.  Is restricting his carbs some now.    Was lifting weights regularly,  Walks on occasion. Stopped these exercises when the pain started.   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   August 16, 2015:  Brian SchwabDonovan was seen a month ago complaining of some chest pains.    myoview did not show any ischemia but did show an EF of 44%. altace was increased to 5 mg a day  Is watching his salt.    Doing well.   BP is well controlled.   Past Medical History  Diagnosis Date  . 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 Mixed hyperlipidemia   . ED (erectile dysfunction)     DUE TO ARTERIAL INSUFFICIENCY  . Arterial insufficiency (HCC)   . Enlarged prostate without lower urinary tract symptoms (luts)   . Persistent proteinuria   . Primary insomnia   . HTN (hypertension)   . Insomnia   . Skin lesion     Past Surgical History  Procedure Laterality Date  . No past surgeries       Current Outpatient Prescriptions  Medication Sig Dispense Refill  .  aspirin 81 MG tablet Take 81 mg by mouth daily.    Marland Kitchen. BAYER CONTOUR NEXT TEST test strip as directed.  0  . LEVEMIR FLEXTOUCH 100 UNIT/ML Pen Inject 52 Units as directed daily.  10  . NOVOLOG FLEXPEN 100 UNIT/ML FlexPen Inject 20 Units as directed daily.  11  . ramipril (ALTACE) 5 MG capsule Take 1 capsule (5 mg total) by mouth daily. 30 capsule 11  . sildenafil (VIAGRA) 100 MG tablet Take 100 mg by mouth daily as needed for erectile dysfunction. Reported on 07/13/2015    . zolpidem (AMBIEN) 10 MG tablet Take 10 mg by mouth at bedtime as needed for sleep. Reported on 07/13/2015     No current facility-administered medications for this visit.    Allergies:   Review of patient's allergies indicates no known allergies.    Social History:  The patient  reports that he has never smoked. He does not have any smokeless tobacco history on file. He reports that he drinks alcohol. He reports that he does not use illicit drugs.   Family History:  The patient's family history includes Breast cancer in his mother; Diabetes in his maternal grandmother and paternal grandmother; Heart attack in his  father; Heart disease in his maternal grandfather; Hypertension in his sister, sister, and sister; Stroke in his maternal grandmother and paternal grandmother.    ROS:  Please see the history of present illness.    Review of Systems: Constitutional:  denies fever, chills, diaphoresis, appetite change and fatigue.  HEENT: denies photophobia, eye pain, redness, hearing loss, ear pain, congestion, sore throat, rhinorrhea, sneezing, neck pain, neck stiffness and tinnitus.  Respiratory: denies SOB, DOE, cough, chest tightness, and wheezing.  Cardiovascular: admits to chest pain,   Gastrointestinal: denies nausea, vomiting, abdominal pain, diarrhea, constipation, blood in stool.  Genitourinary: denies dysuria, urgency, frequency, hematuria, flank pain and difficulty urinating.  Musculoskeletal: denies  myalgias, back  pain, joint swelling, arthralgias and gait problem.   Skin: denies pallor, rash and wound.  Neurological: denies dizziness, seizures, syncope, weakness, light-headedness, numbness and headaches.   Hematological: denies adenopathy, easy bruising, personal or family bleeding history.  Psychiatric/ Behavioral: denies suicidal ideation, mood changes, confusion, nervousness, sleep disturbance and agitation.       All other systems are reviewed and negative.    PHYSICAL EXAM: VS:  BP 124/88 mmHg  Pulse 82  Ht  (1.803 m)  Wt 209 lb 9.6 oz (95.074 kg)  BMI 29.25 kg/m2 , BMI Body mass index is 29.25 kg/(m^2). GEN: Well nourished, well developed, in no acute distress HEENT: normal Neck: no JVD, carotid bruits, or masses Cardiac: RRR; no murmurs, rubs, or gallops,no edema  Respiratory:  clear to auscultation bilaterally, normal work of breathing GI: soft, nontender, nondistended, + BS MS: no deformity or atrophy Skin: warm and dry, no rash Neuro:  Strength and sensation are intact Psych: normal   EKG:  EKG is ordered today. The ekg ordered today demonstrates  NSR at 63.   Possible LAE, NS ST abn.    Recent Labs: 06/02/2015: Hemoglobin 16.3; Platelets 258 08/12/2015: BUN 14; Creat 1.36*; Potassium 4.6; Sodium 139    Lipid Panel No results found for: CHOL, TRIG, HDL, CHOLHDL, VLDL, LDLCALC, LDLDIRECT    Wt Readings from Last 3 Encounters:  08/26/15 209 lb 9.6 oz (95.074 kg)  07/21/15 209 lb (94.802 kg)  07/13/15 209 lb 12.8 oz (95.165 kg)      Other studies Reviewed: Additional studies/ records that were reviewed today include: . Review of the above records demonstrates:    ASSESSMENT AND PLAN:  1.  Chest tightness: Makail presents with episodes of chest tightness. myoview is negative for ischemia  2. Mild chronic systolic CHF:   EF was noted to be 44% on the myoview. Altace was increased to 5 mg a day.   His dyspnea has improved.  Will add coreg 3.125 BID,  increase to 6.25 in 1 month. Return to see me in 3 months .  Echocardiogram   3. Essential HTN.: Well controlled at this point    Current medicines are reviewed at length with the patient today.  The patient does not have concerns regarding medicines.  The following changes have been made:  no change  Labs/ tests ordered today include:  No orders of the defined types were placed in this encounter.    Disposition:   FU with  Me if the myoview is abnormal.     Nahser, Deloris Ping, MD  08/26/2015 7:55 AM    [facility] [address] [place], [place], [place]  [address] Phone: [phone]; Fax: [phone]   [facility]  [address] [place], [place]  [address] [phone]   Fax [phone]

## 2015-08-26 NOTE — Patient Instructions (Signed)
Medication Instructions:  START Carvedilol (Coreg) 6.25 mg twice daily - start 1/2 tab for 2 weeks   Labwork: TODAY - basic metabolic panel   Testing/Procedures: Your physician has requested that you have an echocardiogram. Echocardiography is a painless test that uses sound waves to create images of your heart. It provides your doctor with information about the size and shape of your heart and how well your heart's chambers and valves are working. This procedure takes approximately one hour. There are no restrictions for this procedure.    Follow-Up: Your physician recommends that you schedule a follow-up appointment in: 3 months with Dr. Elease HashimotoNahser   If you need a refill on your cardiac medications before your next appointment, please call your pharmacy.   Thank you for choosing [facility]! Eligha BridegroomMichelle Allia Wiltsey, RN [phone]

## 2015-09-09 ENCOUNTER — Ambulatory Visit (HOSPITAL_COMMUNITY): Payer: BLUE CROSS/BLUE SHIELD | Attending: Cardiovascular Disease

## 2015-09-09 ENCOUNTER — Other Ambulatory Visit: Payer: Self-pay

## 2015-09-09 DIAGNOSIS — Z8249 Family history of ischemic heart disease and other diseases of the circulatory system: Secondary | ICD-10-CM | POA: Diagnosis not present

## 2015-09-09 DIAGNOSIS — I110 Hypertensive heart disease with heart failure: Secondary | ICD-10-CM | POA: Diagnosis not present

## 2015-09-09 DIAGNOSIS — E119 Type 2 diabetes mellitus without complications: Secondary | ICD-10-CM | POA: Diagnosis not present

## 2015-09-09 DIAGNOSIS — E785 Hyperlipidemia, unspecified: Secondary | ICD-10-CM | POA: Diagnosis not present

## 2015-09-09 DIAGNOSIS — I5022 Chronic systolic (congestive) heart failure: Secondary | ICD-10-CM | POA: Diagnosis not present

## 2015-09-09 DIAGNOSIS — I509 Heart failure, unspecified: Secondary | ICD-10-CM | POA: Diagnosis not present

## 2015-11-25 ENCOUNTER — Ambulatory Visit (INDEPENDENT_AMBULATORY_CARE_PROVIDER_SITE_OTHER): Payer: BLUE CROSS/BLUE SHIELD | Admitting: Cardiovascular Disease

## 2015-11-25 ENCOUNTER — Encounter: Payer: Self-pay | Admitting: Cardiovascular Disease

## 2015-11-25 VITALS — BP 130/80 | HR 64 | Ht 71.0 in | Wt 209.1 lb

## 2015-11-25 DIAGNOSIS — I10 Essential (primary) hypertension: Secondary | ICD-10-CM

## 2015-11-25 DIAGNOSIS — I5022 Chronic systolic (congestive) heart failure: Secondary | ICD-10-CM

## 2015-11-25 LAB — LIPID PANEL
CHOL/HDL RATIO: 3.7 ratio (ref <=5.0)
Cholesterol: 190 mg/dL (ref 125–200)
HDL: 51 mg/dL (ref >=40)
LDL CALC: 119 mg/dL (ref <130)
Triglycerides: 99 mg/dL (ref <150)
VLDL: 20 mg/dL (ref <30)

## 2015-11-25 LAB — COMPREHENSIVE METABOLIC PANEL
ALK PHOS: 57 U/L (ref 40–115)
ALT: 28 U/L (ref 9–46)
AST: 21 U/L (ref 10–35)
Albumin: 4 g/dL (ref 3.6–5.1)
BILIRUBIN TOTAL: 0.5 mg/dL (ref 0.2–1.2)
BUN: 20 mg/dL (ref 7–25)
CO2: 28 mmol/L (ref 20–31)
Calcium: 9.4 mg/dL (ref 8.6–10.3)
Chloride: 100 mmol/L (ref 98–110)
Creat: 1.17 mg/dL (ref 0.70–1.33)
GLUCOSE: 172 mg/dL — AB (ref 65–99)
POTASSIUM: 4.2 mmol/L (ref 3.5–5.3)
Sodium: 138 mmol/L (ref 135–146)
Total Protein: 7.1 g/dL (ref 6.1–8.1)

## 2015-11-25 NOTE — Progress Notes (Signed)
Cardiology Office Note   Date:  11/25/2015   ID:  [name], DOB 1965/08/25, MRN [id number]  PCP:  Johny BlamerHARRIS, WILLIAM, MD  Cardiologist:   Kristeen MissPhilip Haig Gerardo, MD   Chief Complaint  Patient presents with  . Follow-up    CHF, HTN   Problem  1. Hyperlipidemia 2. Diabetes mellitus   History of Present Illness: [name] is a 50 y.o. male who presents for  Chest pain    3 months ago , he noticed shooting pains in his chest  He has some chest pain , Went to the ER ,  Work up was negative at that time Now, has CP  on a daily basis - several times a day Not Associated with dyspnea or sweats. Not brought on by exertion, taking a deep breath,  Nor change of position  Thinks that his sleeping position may affect this on occasion  These CP last for a few minutes. He describes this as a heaviness  + family hx of CAD - father and grandfather    Recently has been improving his diet.  Is restricting his carbs some now.    Was lifting weights regularly,  Walks on occasion. Stopped these exercises when the pain started.   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    myoview did not show any ischemia but did show an EF of 44%. altace was increased to 5 mg a day  Is watching his salt.    Doing well.   BP is well controlled.    November 25, 2015:  [name] was seen today for follow up of his Hypertension and systolic congestive heart failure. Breathing much better.  Working out regularly .  Works as a HydrologistBusiness consultant    Past Medical History  Diagnosis Date  . 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 Mixed hyperlipidemia   . ED (erectile dysfunction)     DUE TO ARTERIAL INSUFFICIENCY  . Arterial insufficiency (HCC)   . Enlarged prostate without lower urinary tract symptoms (luts)   . Persistent proteinuria   . Primary insomnia     . HTN (hypertension)   . Insomnia   . Skin lesion     Past Surgical History  Procedure Laterality Date  . No past surgeries       Current Outpatient Prescriptions  Medication Sig Dispense Refill  . aspirin 81 MG tablet Take 81 mg by mouth daily.    Marland Kitchen. BAYER CONTOUR NEXT TEST test strip as directed.  0  . carvedilol (COREG) 6.25 MG tablet Take 1 tablet (6.25 mg total) by mouth 2 (two) times daily. 60 tablet 11  . insulin aspart (NOVOLOG FLEXPEN) 100 UNIT/ML FlexPen Inject into the skin. Inject 15-20 unites subcutaneous two times daily    . LEVEMIR FLEXTOUCH 100 UNIT/ML Pen Inject 52 Units as directed daily.  10  . ramipril (ALTACE) 5 MG capsule Take 1 capsule (5 mg total) by mouth daily. 30 capsule 11  . sildenafil (VIAGRA) 100 MG tablet Take 100 mg by mouth daily as needed for erectile dysfunction. Reported on 07/13/2015    . zolpidem (AMBIEN) 10 MG tablet Take 10 mg by mouth at bedtime as needed for sleep. Reported on 07/13/2015     No current facility-administered medications for this visit.    Allergies:   Review of patient's allergies  indicates no known allergies.    Social History:  The patient  reports that he has never smoked. He does not have any smokeless tobacco history on file. He reports that he drinks alcohol. He reports that he does not use illicit drugs.   Family History:  The patient's family history includes Breast cancer in his mother; Diabetes in his maternal grandmother and paternal grandmother; Heart attack in his father; Heart disease in his maternal grandfather; Hypertension in his sister, sister, and sister; Stroke in his maternal grandmother and paternal grandmother.    ROS:  Please see the history of present illness.    Review of Systems: Constitutional:  denies fever, chills, diaphoresis, appetite change and fatigue.  HEENT: denies photophobia, eye pain, redness, hearing loss, ear pain, congestion, sore throat, rhinorrhea, sneezing, neck pain, neck  stiffness and tinnitus.  Respiratory: denies SOB, DOE, cough, chest tightness, and wheezing.  Cardiovascular: admits to chest pain,   Gastrointestinal: denies nausea, vomiting, abdominal pain, diarrhea, constipation, blood in stool.  Genitourinary: denies dysuria, urgency, frequency, hematuria, flank pain and difficulty urinating.  Musculoskeletal: denies  myalgias, back pain, joint swelling, arthralgias and gait problem.   Skin: denies pallor, rash and wound.  Neurological: denies dizziness, seizures, syncope, weakness, light-headedness, numbness and headaches.   Hematological: denies adenopathy, easy bruising, personal or family bleeding history.  Psychiatric/ Behavioral: denies suicidal ideation, mood changes, confusion, nervousness, sleep disturbance and agitation.       All other systems are reviewed and negative.    PHYSICAL EXAM: VS:  BP 130/80 mmHg  Pulse 64  Ht 5\' 11"  (1.803 m)  Wt 209 lb 1.9 oz (94.856 kg)  BMI 29.18 kg/m2 , BMI Body mass index is 29.18 kg/(m^2). GEN: Well nourished, well developed, in no acute distress HEENT: normal Neck: no JVD, carotid bruits, or masses Cardiac: RRR; no murmurs, rubs, or gallops,no edema  Respiratory:  clear to auscultation bilaterally, normal work of breathing GI: soft, nontender, nondistended, + BS MS: no deformity or atrophy Skin: warm and dry, no rash Neuro:  Strength and sensation are intact Psych: normal   EKG:  EKG is ordered today. The ekg ordered today demonstrates  NSR at 63.   Possible LAE, NS ST abn.    Recent Labs: 06/02/2015: Hemoglobin 16.3; Platelets 258 08/26/2015: BUN 17; Creat 1.33; Potassium 4.2; Sodium 137    Lipid Panel No results found for: CHOL, TRIG, HDL, CHOLHDL, VLDL, LDLCALC, LDLDIRECT    Wt Readings from Last 3 Encounters:  11/25/15 209 lb 1.9 oz (94.856 kg)  08/26/15 209 lb 9.6 oz (95.074 kg)  07/21/15 209 lb (94.802 kg)      Other studies Reviewed: Additional studies/ records that were  reviewed today include: . Review of the above records demonstrates:    ASSESSMENT AND PLAN:  1.  Chest tightness: [name] presents with episodes of chest tightness. myoview is negative for ischemia  2. Mild chronic systolic CHF:   EF was noted to be 44% on the myoview. Altace was increased to 5 mg a day.   His dyspnea has improved.  Echo in April showed normal LV function .    3. Essential HTN.: Well controlled at this point    Current medicines are reviewed at length with the patient today.  The patient does not have concerns regarding medicines.  The following changes have been made:  no change  Labs/ tests ordered today include:  No orders of the defined types were placed in this encounter.    Disposition:  FU with  Me if the myoview is abnormal.     Kristeen Miss, MD  11/25/2015 8:29 AM    [facility] [address] [place], [place], [place]  [address] Phone: [phone]; Fax: [phone]   [facility]  [address] [place], [place]  [address] [phone]   Fax [phone]

## 2015-11-25 NOTE — Patient Instructions (Signed)
Medication Instructions:  None  Labwork: CMET and Lipids today  Testing/Procedures: None  Follow-Up: Your physician wants you to follow-up in: 6 months with Dr. Nahser. You will receive a reminder letter in the mail two months in advance. If you don't receive a letter, please call our office to schedule the follow-up appointment.   Any Other Special Instructions Will Be Listed Below (If Applicable).     If you need a refill on your cardiac medications before your next appointment, please call your pharmacy.   

## 2015-12-23 DIAGNOSIS — G479 Sleep disorder, unspecified: Secondary | ICD-10-CM | POA: Diagnosis not present

## 2015-12-23 DIAGNOSIS — I10 Essential (primary) hypertension: Secondary | ICD-10-CM | POA: Diagnosis not present

## 2015-12-23 DIAGNOSIS — F5101 Primary insomnia: Secondary | ICD-10-CM | POA: Diagnosis not present

## 2015-12-23 DIAGNOSIS — E1029 Type 1 diabetes mellitus with other diabetic kidney complication: Secondary | ICD-10-CM | POA: Diagnosis not present

## 2016-03-27 DIAGNOSIS — G2581 Restless legs syndrome: Secondary | ICD-10-CM | POA: Diagnosis not present

## 2016-03-27 DIAGNOSIS — R0681 Apnea, not elsewhere classified: Secondary | ICD-10-CM | POA: Diagnosis not present

## 2016-03-28 ENCOUNTER — Telehealth: Payer: Self-pay | Admitting: Cardiovascular Disease

## 2016-03-28 MED ORDER — TADALAFIL 10 MG PO TABS: 10.0000 mg | ORAL_TABLET | Freq: Every day | ORAL | 6 refills | Status: DC | PRN

## 2016-03-28 NOTE — Telephone Encounter (Signed)
Spoke with patient and Rx for Cialis sent to pharmacy per patient request due to cost of Viagra.  I advised patient to call back with questions or concerns. He thanked me for the call.

## 2016-03-28 NOTE — Telephone Encounter (Signed)
[name] is calling to see if he can get a prescription sent to his pharmacy for Cialis or Viagra  Sent to his Pharmacy  [facility] in [place] .Marland Kitchen.Please call

## 2016-03-30 ENCOUNTER — Telehealth: Payer: Self-pay | Admitting: Nurse Practitioner

## 2016-03-30 MED ORDER — SILDENAFIL CITRATE 20 MG PO TABS: 100.0000 mg | ORAL_TABLET | Freq: Every day | ORAL | 3 refills | Status: DC | PRN

## 2016-03-30 NOTE — Telephone Encounter (Signed)
Spoke with patient who states Cialis is too expensive and would like a Rx for something different for his erectile dysfunction.  [facility] [place] [facility] get [facility]ild[facility]afil at a lower cost at [facility].  I advised that I am sending Rx and to call back with questions or concerns.  He thanked me for the call.

## 2016-04-06 ENCOUNTER — Telehealth: Payer: Self-pay

## 2016-04-06 NOTE — Telephone Encounter (Signed)
Rx for [facility] per patient request

## 2016-04-06 NOTE — Telephone Encounter (Signed)
Cialis has been denied by [employer]. They say Viagra is the preferred drug.

## 2016-04-06 NOTE — Telephone Encounter (Signed)
Agree with change to [facility]ild[facility] to [facility]

## 2016-04-13 DIAGNOSIS — G4733 Obstructive sleep apnea (adult) (pediatric): Secondary | ICD-10-CM | POA: Diagnosis not present

## 2016-05-01 ENCOUNTER — Ambulatory Visit (INDEPENDENT_AMBULATORY_CARE_PROVIDER_SITE_OTHER): Payer: BLUE CROSS/BLUE SHIELD | Admitting: Cardiovascular Disease

## 2016-05-01 ENCOUNTER — Encounter: Payer: Self-pay | Admitting: Cardiovascular Disease

## 2016-05-01 VITALS — BP 124/80 | HR 61 | Ht 71.0 in | Wt 211.0 lb

## 2016-05-01 DIAGNOSIS — E782 Mixed hyperlipidemia: Secondary | ICD-10-CM | POA: Diagnosis not present

## 2016-05-01 DIAGNOSIS — I5022 Chronic systolic (congestive) heart failure: Secondary | ICD-10-CM

## 2016-05-01 DIAGNOSIS — I10 Essential (primary) hypertension: Secondary | ICD-10-CM

## 2016-05-01 NOTE — Patient Instructions (Signed)

## 2016-05-01 NOTE — Progress Notes (Signed)
Cardiology Office Note   Date:  05/01/2016   ID:  [name], DOB 09-Dec-1965, MRN [id number]  PCP:  Johny Blamer, MD  Cardiologist:   Kristeen Miss, MD   Chief Complaint  Patient presents with  . Follow-up    CHF   Problem  1. Hyperlipidemia 2. Diabetes mellitus 3.  Hx of systolic CHF - improved now on meds.   [name] is a 50 y.o. male who presents for  Chest pain    3 months ago , he noticed shooting pains in his chest  He has some chest pain , Went to the ER ,  Work up was negative at that time Now, has CP  on a daily basis - several times a day Not Associated with dyspnea or sweats. Not brought on by exertion, taking a deep breath,  Nor change of position  Thinks that his sleeping position may affect this on occasion  These CP last for a few minutes. He describes this as a heaviness  + family hx of CAD - father and grandfather    Recently has been improving his diet.  Is restricting his carbs some now.    Was lifting weights regularly,  Walks on occasion. Stopped these exercises when the pain started.   Works as a Engineer, building services .   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    myoview did not show any ischemia but did show an EF of 44%. altace was increased to 5 mg a day  Is watching his salt.    Doing well.   BP is well controlled.   November 25, 2015:  [name] was seen today for follow up of his Hypertension and systolic congestive heart failure. Breathing much better.  Working out regularly .  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 ( insurance did not approve one of his diabetic meds.  bP and HR are well controlled.    Past Medical History:  Diagnosis Date  . Arterial insufficiency (HCC)   . ED (erectile dysfunction)    DUE TO ARTERIAL INSUFFICIENCY  . Enlarged prostate without  lower urinary tract symptoms (luts)   . HTN (hypertension)   . Insomnia   . Mixed hyperlipidemia   . Persistent proteinuria   . Primary insomnia   . Skin lesion   . 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 NO PAST SURGERIES       Current Outpatient Prescriptions  Medication Sig Dispense Refill  . aspirin 81 MG tablet Take 81 mg by mouth daily.    Marland Kitchen BAYER CONTOUR NEXT TEST test strip as directed.  0  . carvedilol (COREG) 6.25 MG tablet Take 1 tablet (6.25 mg total) by mouth 2 (two) times daily. 60 tablet 11  . insulin aspart (NOVOLOG FLEXPEN) 100 UNIT/ML FlexPen Inject into the skin. Inject 15-20 unites subcutaneous three times daily    . LEVEMIR FLEXTOUCH 100 UNIT/ML Pen Inject 52 Units as directed daily.  10  . ramipril (ALTACE) 5 MG capsule Take 1 capsule (5 mg total) by mouth daily. 30 capsule 11  . sildenafil (REVATIO) 20 MG tablet Take 5 tablets (100 mg total) by mouth daily as needed. 50 tablet 3  . zolpidem (AMBIEN) 10 MG tablet Take  10 mg by mouth at bedtime as needed for sleep. Reported on 07/13/2015     No current facility-administered medications for this visit.     Allergies:   Patient has no known allergies.    Social History:  The patient  reports that he has never smoked. He does not have any smokeless tobacco history on file. He reports that he drinks alcohol. He reports that he does not use drugs.   Family History:  The patient's family history includes Breast cancer in his mother; Diabetes in his maternal grandmother and paternal grandmother; Heart attack in his father; Heart disease in his maternal grandfather; Hypertension in his sister, sister, and sister; Stroke in his maternal grandmother and paternal grandmother.    ROS:  Please see the history of present illness.    Review of Systems: Constitutional:  denies fever, chills, diaphoresis, appetite change and fatigue.    HEENT: denies photophobia, eye pain, redness, hearing loss, ear pain, congestion, sore throat, rhinorrhea, sneezing, neck pain, neck stiffness and tinnitus.  Respiratory: denies SOB, DOE, cough, chest tightness, and wheezing.  Cardiovascular: admits to chest pain,   Gastrointestinal: denies nausea, vomiting, abdominal pain, diarrhea, constipation, blood in stool.  Genitourinary: denies dysuria, urgency, frequency, hematuria, flank pain and difficulty urinating.  Musculoskeletal: denies  myalgias, back pain, joint swelling, arthralgias and gait problem.   Skin: denies pallor, rash and wound.  Neurological: denies dizziness, seizures, syncope, weakness, light-headedness, numbness and headaches.   Hematological: denies adenopathy, easy bruising, personal or family bleeding history.  Psychiatric/ Behavioral: denies suicidal ideation, mood changes, confusion, nervousness, sleep disturbance and agitation.       All other systems are reviewed and negative.    PHYSICAL EXAM: VS:  BP 124/80   Pulse 61   Ht 5\' 11"  (1.803 m)   Wt 211 lb (95.7 kg)   BMI 29.43 kg/m  , BMI Body mass index is 29.43 kg/m. GEN: Well nourished, well developed, in no acute distress  HEENT: normal  Neck: no JVD, carotid bruits, or masses Cardiac: RRR; no murmurs, rubs, or gallops,no edema  Respiratory:  clear to auscultation bilaterally, normal work of breathing GI: soft, nontender, nondistended, + BS MS: no deformity or atrophy  Skin: warm and dry, no rash Neuro:  Strength and sensation are intact Psych: normal   EKG:  EKG is ordered today. The ekg ordered Dec. 4, 2017  demonstrates   NSR at 61.  Normal ECG   Recent Labs: 06/02/2015: Hemoglobin 16.3; Platelets 258 11/25/2015: ALT 28; BUN 20; Creat 1.17; Potassium 4.2; Sodium 138    Lipid Panel    Component Value Date/Time   CHOL 190 11/25/2015 0839   TRIG 99 11/25/2015 0839   HDL 51 11/25/2015 0839   CHOLHDL 3.7 11/25/2015 0839   VLDL 20 11/25/2015  0839   LDLCALC 119 11/25/2015 0839      Wt Readings from Last 3 Encounters:  05/01/16 211 lb (95.7 kg)  11/25/15 209 lb 1.9 oz (94.9 kg)  08/26/15 209 lb 9.6 oz (95.1 kg)      Other studies Reviewed: Additional studies/ records that were reviewed today include: . Review of the above records demonstrates:    ASSESSMENT AND PLAN:  1.  Chest tightness: [name] presents with episodes of chest tightness. myoview is negative for ischemia  2. Mild chronic systolic CHF:   EF was noted to be 44% on the myoview. Altace was increased to 5 mg a day.   His dyspnea has improved.  Echo in  April showed normal LV function.    3. Essential HTN.: Well controlled at this point    Current medicines are reviewed at length with the patient today.  The patient does not have concerns regarding medicines.  The following changes have been made:  no change  Labs/ tests ordered today include:  No orders of the defined types were placed in this encounter.   Disposition:   FU with  [name] in 6 months      Kristeen MissPhilip Axten Pascucci, MD  05/01/2016 9:00 AM    [facility] [address], [place], [place]  [address] Phone: [phone]; Fax: [phone]

## 2016-05-05 DIAGNOSIS — H25012 Cortical age-related cataract, left eye: Secondary | ICD-10-CM | POA: Diagnosis not present

## 2016-05-05 DIAGNOSIS — Q130 Coloboma of iris: Secondary | ICD-10-CM | POA: Diagnosis not present

## 2016-05-05 DIAGNOSIS — H53022 Refractive amblyopia, left eye: Secondary | ICD-10-CM | POA: Diagnosis not present

## 2016-05-05 DIAGNOSIS — E119 Type 2 diabetes mellitus without complications: Secondary | ICD-10-CM | POA: Diagnosis not present

## 2016-05-12 ENCOUNTER — Ambulatory Visit: Payer: BLUE CROSS/BLUE SHIELD | Admitting: Cardiovascular Disease

## 2016-05-24 DIAGNOSIS — G4733 Obstructive sleep apnea (adult) (pediatric): Secondary | ICD-10-CM | POA: Diagnosis not present

## 2016-05-25 DIAGNOSIS — E1029 Type 1 diabetes mellitus with other diabetic kidney complication: Secondary | ICD-10-CM | POA: Diagnosis not present

## 2016-07-25 ENCOUNTER — Other Ambulatory Visit: Payer: Self-pay | Admitting: *Deleted

## 2016-07-25 MED ORDER — RAMIPRIL 5 MG PO CAPS
5.0000 mg | ORAL_CAPSULE | Freq: Every day | ORAL | 3 refills | Status: DC
Start: 2016-07-25 — End: 2016-12-02

## 2016-09-04 ENCOUNTER — Other Ambulatory Visit: Payer: Self-pay | Admitting: Cardiovascular Disease

## 2016-09-29 ENCOUNTER — Other Ambulatory Visit: Payer: Self-pay | Admitting: Cardiovascular Disease

## 2016-10-09 DIAGNOSIS — E1029 Type 1 diabetes mellitus with other diabetic kidney complication: Secondary | ICD-10-CM | POA: Diagnosis not present

## 2016-10-09 DIAGNOSIS — I10 Essential (primary) hypertension: Secondary | ICD-10-CM | POA: Diagnosis not present

## 2016-11-13 ENCOUNTER — Telehealth: Payer: Self-pay | Admitting: Cardiovascular Disease

## 2016-11-13 NOTE — Telephone Encounter (Signed)
Pt c/o Shortness Of Breath: STAT if SOB developed within the last 24 hours or pt is noticeably SOB on the phone  1. Are you currently SOB (can you hear that pt is SOB on the phone)? No   2. How long have you been experiencing SOB? About five weeks   3. Are you SOB when sitting or when up moving around? Both and is "pretty constant"   4. Are you currently experiencing any other symptoms? Lower energy, dry eyes feeling "flushed or dry"   Patient is wondering if he needs to come in sooner to see Dr. Elease HashimotoNahser

## 2016-11-13 NOTE — Telephone Encounter (Signed)
Spoke with patient who states he is having more SOB over the last month. He states he waited a few weeks to make certain the SOB was not r/t to allergies or sinus issues but it has persisted. He states SOB noticeable when exercising. He reports an approximate 5 lb weight gain over the past few months.  He states systolic BP has been 140's mmHg and diastolic is 75-80 mmHg. States no recent lab work, due in 3 months with PCP. States he has improved his diet, states eating less fried foods and more salads. When I asked about swelling he reports tightness in his abdomen. He reports compliance with his medication regimen. I offered him an appointment with an APP this week due to no openings on Dr. Harvie BridgeNahser's schedule. He is scheduled to see Brian ReedyMichele Lenze, PA tomorrow, 6/19. He thanked me for the call.

## 2016-11-14 ENCOUNTER — Ambulatory Visit: Payer: BLUE CROSS/BLUE SHIELD | Admitting: Physician Assistant

## 2016-11-14 ENCOUNTER — Ambulatory Visit (INDEPENDENT_AMBULATORY_CARE_PROVIDER_SITE_OTHER): Payer: BLUE CROSS/BLUE SHIELD | Admitting: Physician Assistant

## 2016-11-14 ENCOUNTER — Encounter: Payer: Self-pay | Admitting: Physician Assistant

## 2016-11-14 VITALS — BP 130/78 | HR 73 | Ht 71.0 in | Wt 213.0 lb

## 2016-11-14 DIAGNOSIS — I5022 Chronic systolic (congestive) heart failure: Secondary | ICD-10-CM

## 2016-11-14 DIAGNOSIS — R0600 Dyspnea, unspecified: Secondary | ICD-10-CM

## 2016-11-14 DIAGNOSIS — E1029 Type 1 diabetes mellitus with other diabetic kidney complication: Secondary | ICD-10-CM

## 2016-11-14 DIAGNOSIS — I10 Essential (primary) hypertension: Secondary | ICD-10-CM

## 2016-11-14 LAB — D-DIMER, QUANTITATIVE: D-DIMER: 0.23 mg/L FEU (ref 0.00–0.49)

## 2016-11-14 NOTE — Progress Notes (Signed)
Cardiology Office Note    Date:  11/14/2016   ID:  [name], DOB 10/21/1965, MRN [id number]  PCP:  Johny Blamer, MD  Cardiologist: Dr. Elease Hashimoto  Chief Complaint  Patient presents with  . Shortness of Breath    History of Present Illness:  [name] is a 51 y.o. male originally seen by Dr. Elease Hashimoto 06/2015 for chest pain with typical and atypical features. He had several risk factors for CAD including positive family history, poorly controlled diabetes, hypertension and HLD. Lexi scan Myoview 06/2015 showed markedly decreased LV function EF 44% but per fusion was normal. His felt to be intermediate risk because of mild to moderate LV dysfunction. Follow-up 2-D echo 08/2015 LVEF 60-65% with no wall motion abnormalities. He did have mild posterior wall and severe septal hypertrophy.  Patient is added onto my schedule today because of worsening dyspnea on exertion. Patient states he's had shortness of breath at rest and with exertion for over a month now. He says it feels similar to when he had decreased heart function last year. He had a bad cold about 2-1/2 months ago but no other viruses. He denies any chest pain or tightness. He hasn't been able exercise because he gets too short of breath. He travels as a Research scientist (medical) but has not had any long distance travel since February. He denies any leg swelling or edema. He says his blood pressure has been well controlled.  Past Medical History:  Diagnosis Date  . Arterial insufficiency (HCC)   . ED (erectile dysfunction)    DUE TO ARTERIAL INSUFFICIENCY  . Enlarged prostate without lower urinary tract symptoms (luts)   . HTN (hypertension)   . Insomnia   . Mixed hyperlipidemia   . Persistent proteinuria   . Primary insomnia   . Skin lesion   . 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 NO PAST SURGERIES      Current Medications: Outpatient  Medications Prior to Visit  Medication Sig Dispense Refill  . aspirin 81 MG tablet Take 81 mg by mouth daily.    Marland Kitchen BAYER CONTOUR NEXT TEST test strip as directed.  0  . carvedilol (COREG) 6.25 MG tablet Take 1 tablet (6.25 mg total) by mouth 2 (two) times daily. *Please call and schedule a six month follow up per last office visit* 60 tablet 6  . insulin aspart (NOVOLOG FLEXPEN) 100 UNIT/ML FlexPen Inject into the skin. Inject 15-20 unites subcutaneous three times daily    . LEVEMIR FLEXTOUCH 100 UNIT/ML Pen Inject 52 Units as directed daily.  10  . ramipril (ALTACE) 5 MG capsule Take 1 capsule (5 mg total) by mouth daily. 30 capsule 3  . sildenafil (REVATIO) 20 MG tablet Take 5 tablets (100 mg total) by mouth daily as needed. 50 tablet 3  . zolpidem (AMBIEN) 10 MG tablet Take 10 mg by mouth at bedtime as needed for sleep. Reported on 07/13/2015     No facility-administered medications prior to visit.      Allergies:   Patient has no known allergies.   Social History   Social History  . Marital status: Single    Spouse name: N/A  . Number of children: N/A  . Years of education: N/A   Social History Main Topics  . Smoking status: Never Smoker  . Smokeless tobacco: Never Used  . Alcohol use Yes  . Drug use: No  .  Sexual activity: Not Asked   Other Topics Concern  . None   Social History Narrative  . None     Family History:  The patient's family history includes Breast cancer in his mother; Diabetes in his maternal grandmother and paternal grandmother; Heart attack in his father; Heart disease in his maternal grandfather; Hypertension in his sister, sister, and sister; Stroke in his maternal grandmother and paternal grandmother.   ROS:   Please see the history of present illness.    Review of Systems  Constitution: Negative.  HENT: Negative.   Cardiovascular: Negative.   Respiratory: Positive for shortness of breath and snoring.   Endocrine: Negative.     Hematologic/Lymphatic: Negative.   Musculoskeletal: Negative.   Gastrointestinal: Negative.   Genitourinary: Negative.   Neurological: Negative.    All other systems reviewed and are negative.   PHYSICAL EXAM:   VS:  BP 130/78   Pulse 73   Ht 5\' 11"  (1.803 m)   Wt 213 lb (96.6 kg)   SpO2 96%   BMI 29.71 kg/m   Physical Exam  GEN: Well nourished, well developed, in no acute distress  Neck: no JVD, carotid bruits, or masses Cardiac:RRR; no murmurs, rubs, or gallops  Respiratory:  clear to auscultation bilaterally, normal work of breathing GI: soft, nontender, nondistended, + BS Ext: without cyanosis, clubbing, or edema, Good distal pulses bilaterally Neuro:  Alert and Oriented x 3 Psych: euthymic mood, full affect  Wt Readings from Last 3 Encounters:  11/14/16 213 lb (96.6 kg)  05/01/16 211 lb (95.7 kg)  11/25/15 209 lb 1.9 oz (94.9 kg)      Studies/Labs Reviewed:   EKG:  EKG is  ordered today.  The ekg ordered today demonstrates Normal sinus rhythm at 63 bpm, no acute change  Recent Labs: 11/25/2015: ALT 28; BUN 20; Creat 1.17; Potassium 4.2; Sodium 138   Lipid Panel    Component Value Date/Time   CHOL 190 11/25/2015 0839   TRIG 99 11/25/2015 0839   HDL 51 11/25/2015 0839   CHOLHDL 3.7 11/25/2015 0839   VLDL 20 11/25/2015 0839   LDLCALC 119 11/25/2015 0839    Additional studies/ records that were reviewed today include:  Lexiscan 06/2015 Study Highlights   The left ventricular ejection fraction is moderately decreased (30-44%).  Nuclear stress EF: 44%.  The myocardial perfusion study is normal.  This is an intermediate risk study due to mild to moderate LV dysfunction.     2Decho 08/2015 Study Conclusions   - Left ventricle: The cavity size was normal. Systolic function was   normal. The estimated ejection fraction was in the range of 60%   to 65%. Wall motion was normal; there were no regional wall   motion abnormalities. Left ventricular diastolic  function   parameters were normal. Mild posterior wall and severe septal   hypertrophy. - Left atrium: The atrium was mildly dilated. - Right ventricle: The cavity size was mildly dilated.      ASSESSMENT:    1. Dyspnea, unspecified type   2. Chronic systolic CHF (congestive heart failure) (HCC)   3. Essential hypertension   4. 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 No evidence of heart failure and exam. EKG normal. We'll check 2-D echo to rule out worsening LV function. Also  check labs including CBC, CMET, BNP, d-dimer and hemoglobin A1c per his request as it was elevated slightly in the last time was checked.  Chronic systolic CHF with LVEF 44% in the past 06/2015 normalized 08/2015. Now with recurrent symptoms. No evidence of heart failure and exam today.  Hypertension well controlled will not make adjustment in medications until the echo is back.  Diabetes mellitus managed by primary care. Hemoglobin A1c was higher than supposed to be in December so we will recheck it.   Medication Adjustments/Labs and Tests Ordered: Current medicines are reviewed at length with the patient today.  Concerns regarding medicines are outlined above.  Medication changes, Labs and Tests ordered today are listed in the Patient Instructions below. Patient Instructions  Medication Instructions:  Your physician recommends that you continue on your current medications as directed. Please refer to the Current Medication list given to you today.  Labwork: Please have labwork TODAY (CBC, CMET, D-dimer, TSH, HgA1C)   Testing/Procedures: Your physician has requested that you have an echocardiogram (1st available). Echocardiography is a painless test that uses sound waves to create images of your heart. It provides your doctor with  information about the size and shape of your heart and how well your heart's chambers and valves are working. This procedure takes approximately one hour. There are no restrictions for this procedure.   Follow-Up: Your physician recommends that you schedule a follow-up appointment: after testing with Dr. Elease HashimotoNahser.   Any Other Special Instructions Will Be Listed Below (If Applicable).     If you need a refill on your cardiac medications before your next appointment, please call your pharmacy.      Elson ClanSigned, Michele Lenze, PA-C  11/14/2016 12:54 PM    [facility] [address], [place], [place]  [address] Phone: [phone](336) [phone]; Fax: [phone]) [phone]

## 2016-11-14 NOTE — Patient Instructions (Addendum)
Medication Instructions:  Your physician recommends that you continue on your current medications as directed. Please refer to the Current Medication list given to you today.  Labwork: Please have labwork TODAY (CBC, CMET, D-dimer, TSH, HgA1C)   Testing/Procedures: Your physician has requested that you have an echocardiogram (1st available). Echocardiography is a painless test that uses sound waves to create images of your heart. It provides your doctor with information about the size and shape of your heart and how well your heart's chambers and valves are working. This procedure takes approximately one hour. There are no restrictions for this procedure.   Follow-Up: Your physician recommends that you schedule a follow-up appointment: after testing with Dr. Elease HashimotoNahser.   Any Other Special Instructions Will Be Listed Below (If Applicable).     If you need a refill on your cardiac medications before your next appointment, please call your pharmacy.

## 2016-11-15 ENCOUNTER — Telehealth: Payer: Self-pay | Admitting: Cardiovascular Disease

## 2016-11-15 LAB — COMPREHENSIVE METABOLIC PANEL
ALK PHOS: 64 IU/L (ref 39–117)
ALT: 23 IU/L (ref 0–44)
AST: 18 IU/L (ref 0–40)
Albumin/Globulin Ratio: 1.5 (ref 1.2–2.2)
Albumin: 4.6 g/dL (ref 3.5–5.5)
BUN/Creatinine Ratio: 15 (ref 9–20)
BUN: 17 mg/dL (ref 6–24)
Bilirubin Total: 0.5 mg/dL (ref 0.0–1.2)
CO2: 23 mmol/L (ref 20–29)
CREATININE: 1.14 mg/dL (ref 0.76–1.27)
Calcium: 9.7 mg/dL (ref 8.7–10.2)
Chloride: 98 mmol/L (ref 96–106)
GFR calc Af Amer: 86 mL/min/{1.73_m2} (ref >59)
GFR calc non Af Amer: 74 mL/min/{1.73_m2} (ref >59)
GLOBULIN, TOTAL: 3.1 g/dL (ref 1.5–4.5)
GLUCOSE: 170 mg/dL — AB (ref 65–99)
Potassium: 4.5 mmol/L (ref 3.5–5.2)
SODIUM: 138 mmol/L (ref 134–144)
Total Protein: 7.7 g/dL (ref 6.0–8.5)

## 2016-11-15 LAB — HEMOGLOBIN A1C
Est. average glucose Bld gHb Est-mCnc: 206 mg/dL
HEMOGLOBIN A1C: 8.8 % — AB (ref 4.8–5.6)

## 2016-11-15 LAB — CBC
Hematocrit: 43.9 % (ref 37.5–51.0)
Hemoglobin: 15.5 g/dL (ref 13.0–17.7)
MCH: 31.6 pg (ref 26.6–33.0)
MCHC: 35.3 g/dL (ref 31.5–35.7)
MCV: 89 fL (ref 79–97)
Platelets: 281 10*3/uL (ref 150–379)
RBC: 4.91 x10E6/uL (ref 4.14–5.80)
RDW: 13.2 % (ref 12.3–15.4)
WBC: 5 10*3/uL (ref 3.4–10.8)

## 2016-11-15 LAB — TSH: TSH: 3.2 u[IU]/mL (ref 0.450–4.500)

## 2016-11-15 LAB — PRO B NATRIURETIC PEPTIDE: NT-Pro BNP: 57 pg/mL (ref 0–121)

## 2016-11-15 NOTE — Telephone Encounter (Signed)
Returned pts call and went over his TSH level.

## 2016-11-15 NOTE — Telephone Encounter (Signed)
Follow Up:    Returning your cal lconcerning his lab results.

## 2016-11-17 ENCOUNTER — Other Ambulatory Visit: Payer: Self-pay

## 2016-11-17 ENCOUNTER — Ambulatory Visit (HOSPITAL_COMMUNITY): Payer: BLUE CROSS/BLUE SHIELD | Attending: Cardiovascular Disease

## 2016-11-17 DIAGNOSIS — I5022 Chronic systolic (congestive) heart failure: Secondary | ICD-10-CM | POA: Insufficient documentation

## 2016-11-17 DIAGNOSIS — R0600 Dyspnea, unspecified: Secondary | ICD-10-CM | POA: Insufficient documentation

## 2016-11-22 ENCOUNTER — Telehealth: Payer: Self-pay | Admitting: Cardiovascular Disease

## 2016-11-22 DIAGNOSIS — R0602 Shortness of breath: Secondary | ICD-10-CM

## 2016-11-22 NOTE — Telephone Encounter (Signed)
°  Follow Up   Pt returning call from yesterday regarding lab and testing results. Please call.

## 2016-11-22 NOTE — Telephone Encounter (Signed)
Pt is aware of echo result  and PA's recommendations. Pt states he still has SOB with activity, and agreed to have a stress test. An Excessive Myoview place on EPIC.

## 2016-11-22 NOTE — Telephone Encounter (Signed)
Left pt a message to call back regarding echo results.

## 2016-11-24 ENCOUNTER — Telehealth (HOSPITAL_COMMUNITY): Payer: Self-pay | Admitting: *Deleted

## 2016-11-24 NOTE — Telephone Encounter (Signed)
Left message on voicemail per DPR in reference to upcoming appointment scheduled on 11/27/16 at 7:45 with detailed instructions given per Myocardial Perfusion Study Information Sheet for the test. LM to arrive 15 minutes early, and that it is imperative to arrive on time for appointment to keep from having the test rescheduled. If you need to cancel or reschedule your appointment, please call the office within 24 hours of your appointment. Failure to do so may result in a cancellation of your appointment, and a $50 no show fee. Phone number given for call back for any questions. Daneil DolinSharon S Brooks

## 2016-11-27 ENCOUNTER — Ambulatory Visit (HOSPITAL_COMMUNITY): Payer: BLUE CROSS/BLUE SHIELD | Attending: Physician Assistant

## 2016-11-27 DIAGNOSIS — E119 Type 2 diabetes mellitus without complications: Secondary | ICD-10-CM | POA: Insufficient documentation

## 2016-11-27 DIAGNOSIS — I10 Essential (primary) hypertension: Secondary | ICD-10-CM | POA: Insufficient documentation

## 2016-11-27 DIAGNOSIS — R0602 Shortness of breath: Secondary | ICD-10-CM | POA: Diagnosis not present

## 2016-11-27 DIAGNOSIS — R079 Chest pain, unspecified: Secondary | ICD-10-CM | POA: Insufficient documentation

## 2016-11-27 LAB — MYOCARDIAL PERFUSION IMAGING
CHL CUP NUCLEAR SRS: 0
CSEPEDS: 0 s
Estimated workload: 10.1 METS
Exercise duration (min): 9 min
LHR: 0.31
LV dias vol: 102 mL (ref 62–150)
LV sys vol: 56 mL
MPHR: 168 {beats}/min
NUC STRESS TID: 1.05
Peak HR: 153 {beats}/min
Percent HR: 91 %
Rest HR: 64 {beats}/min
SDS: 3
SSS: 3

## 2016-11-27 MED ORDER — TECHNETIUM TC 99M TETROFOSMIN IV KIT
10.4000 | PACK | Freq: Once | INTRAVENOUS | Status: AC | PRN
  Administered 2016-11-27: 10.4 via INTRAVENOUS
  Filled 2016-11-27: qty 11

## 2016-11-27 MED ORDER — TECHNETIUM TC 99M TETROFOSMIN IV KIT
32.8000 | PACK | Freq: Once | INTRAVENOUS | Status: AC | PRN
  Administered 2016-11-27: 32.8 via INTRAVENOUS
  Filled 2016-11-27: qty 33

## 2016-12-02 ENCOUNTER — Other Ambulatory Visit: Payer: Self-pay | Admitting: Cardiovascular Disease

## 2017-01-02 DIAGNOSIS — M1711 Unilateral primary osteoarthritis, right knee: Secondary | ICD-10-CM | POA: Diagnosis not present

## 2017-01-02 DIAGNOSIS — M1712 Unilateral primary osteoarthritis, left knee: Secondary | ICD-10-CM | POA: Diagnosis not present

## 2017-01-08 DIAGNOSIS — M170 Bilateral primary osteoarthritis of knee: Secondary | ICD-10-CM | POA: Diagnosis not present

## 2017-01-22 DIAGNOSIS — F5101 Primary insomnia: Secondary | ICD-10-CM | POA: Diagnosis not present

## 2017-01-22 DIAGNOSIS — Z23 Encounter for immunization: Secondary | ICD-10-CM | POA: Diagnosis not present

## 2017-01-22 DIAGNOSIS — E782 Mixed hyperlipidemia: Secondary | ICD-10-CM | POA: Diagnosis not present

## 2017-01-22 DIAGNOSIS — I10 Essential (primary) hypertension: Secondary | ICD-10-CM | POA: Diagnosis not present

## 2017-01-22 DIAGNOSIS — E1029 Type 1 diabetes mellitus with other diabetic kidney complication: Secondary | ICD-10-CM | POA: Diagnosis not present

## 2017-02-05 ENCOUNTER — Ambulatory Visit: Payer: BLUE CROSS/BLUE SHIELD | Admitting: Cardiovascular Disease

## 2017-04-04 ENCOUNTER — Encounter (INDEPENDENT_AMBULATORY_CARE_PROVIDER_SITE_OTHER): Payer: Self-pay

## 2017-04-04 ENCOUNTER — Ambulatory Visit (INDEPENDENT_AMBULATORY_CARE_PROVIDER_SITE_OTHER): Payer: BLUE CROSS/BLUE SHIELD | Admitting: Cardiovascular Disease

## 2017-04-04 ENCOUNTER — Encounter: Payer: Self-pay | Admitting: Cardiovascular Disease

## 2017-04-04 VITALS — BP 116/80 | HR 62 | Ht 71.0 in | Wt 216.2 lb

## 2017-04-04 DIAGNOSIS — Z125 Encounter for screening for malignant neoplasm of prostate: Secondary | ICD-10-CM | POA: Diagnosis not present

## 2017-04-04 DIAGNOSIS — I5022 Chronic systolic (congestive) heart failure: Secondary | ICD-10-CM | POA: Diagnosis not present

## 2017-04-04 DIAGNOSIS — Z13220 Encounter for screening for lipoid disorders: Secondary | ICD-10-CM

## 2017-04-04 DIAGNOSIS — E782 Mixed hyperlipidemia: Secondary | ICD-10-CM | POA: Diagnosis not present

## 2017-04-04 DIAGNOSIS — E1029 Type 1 diabetes mellitus with other diabetic kidney complication: Secondary | ICD-10-CM | POA: Diagnosis not present

## 2017-04-04 DIAGNOSIS — I10 Essential (primary) hypertension: Secondary | ICD-10-CM | POA: Diagnosis not present

## 2017-04-04 NOTE — Patient Instructions (Signed)

## 2017-04-04 NOTE — Progress Notes (Signed)
Cardiology Office Note   Date:  04/04/2017   ID:  [name], DOB Jul 11, 1965, MRN [id number]  PCP:  Johny BlamerHarris, William, MD  Cardiologist:   Kristeen MissPhilip Hope Holst, MD   Chief Complaint  Patient presents with  . Follow-up    CHF   Problem  1. Hyperlipidemia 2. Diabetes mellitus 3.  Hx of systolic CHF - improved now on meds.   [name] is a 51 y.o. male who presents for  Chest pain    3 months ago , he noticed shooting pains in his chest  He has some chest pain , Went to the ER ,  Work up was negative at that time Now, has CP  on a daily basis - several times a day Not Associated with dyspnea or sweats. Not brought on by exertion, taking a deep breath,  Nor change of position  Thinks that his sleeping position may affect this on occasion  These CP last for a few minutes. He describes this as a heaviness  + family hx of CAD - father and grandfather    Recently has been improving his diet.  Is restricting his carbs some now.    Was lifting weights regularly,  Walks on occasion. Stopped these exercises when the pain started.   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    myoview did not show any ischemia but did show an EF of 44%. altace was increased to 5 mg a day  Is watching his salt.    Doing well.   BP is well controlled.   November 25, 2015:  [name] was seen today for follow up of his Hypertension and systolic congestive heart failure. Breathing much better.  Working out regularly .  Works as a HydrologistBusiness consultant   Dec. 4, 2017:  Doing well from a cardiac standpoint Diabetes is not well controlled. ( insurance did not approve one of his diabetic meds.  bP and HR are well controlled.   Nov. 7, 2018::  [name] is doing well  Hx of CHF Not exercising , too tired, traveling too much 3-4 months - [place]  Avoiding salt.       Past Medical History:  Diagnosis Date  . Arterial insufficiency (HCC)   . ED (erectile dysfunction)    DUE TO ARTERIAL INSUFFICIENCY  . Enlarged prostate without lower urinary tract symptoms (luts)   . HTN (hypertension)   . Insomnia   . Mixed hyperlipidemia   . Persistent proteinuria   . Primary insomnia   . Skin lesion   . 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 NO PAST SURGERIES       Current Outpatient Medications  Medication Sig Dispense Refill  . aspirin 81 MG tablet Take 81 mg by mouth daily.    Marland Kitchen. BAYER CONTOUR NEXT TEST test strip as directed.  0  . carvedilol (COREG) 6.25 MG tablet Take 1 tablet (6.25 mg total) by mouth 2 (two) times daily. *Please call and schedule a six month follow up per last office visit* 60 tablet 6  . insulin aspart (NOVOLOG FLEXPEN) 100 UNIT/ML FlexPen Inject into the skin. Inject 15-20 unites subcutaneous three times daily    . LEVEMIR FLEXTOUCH 100 UNIT/ML Pen Inject 52 Units as directed daily.  10  . ramipril (ALTACE) 5 MG capsule TAKE 1 CAPSULE(5 MG) BY MOUTH DAILY 30 capsule 10  . sildenafil (REVATIO) 20 MG tablet Take 5 tablets (100 mg total) by mouth daily as needed. 50 tablet 3  . TOUJEO SOLOSTAR 300 UNIT/ML SOPN Inject 60 mg daily as directed.  6  . zolpidem (AMBIEN) 10 MG tablet Take 10 mg by mouth at bedtime as needed for sleep. Reported on 07/13/2015     No current facility-administered medications for this visit.     Allergies:   Patient has no known allergies.    Social History:  The patient  reports that  has never smoked. he has never used smokeless tobacco. He reports that he drinks alcohol. He reports that he does not use drugs.   Family History:  The patient's family history includes Breast cancer in his mother; Diabetes in his maternal grandmother and paternal grandmother; Heart attack in his father; Heart disease in his maternal  grandfather; Hypertension in his sister, sister, and sister; Stroke in his maternal grandmother and paternal grandmother.    ROS: as per  history of present illness otherwise negative.     All other systems are reviewed and negative.   Physical Exam: Blood pressure 116/80, pulse 62, height 5\' 11"  (1.803 m), weight 216 lb 3.2 oz (98.1 kg), SpO2 98 %.  GEN:  Well nourished, well developed in no acute distress HEENT: Normal NECK: No JVD; No carotid bruits LYMPHATICS: No lymphadenopathy CARDIAC: RR, no murmurs, rubs, gallops RESPIRATORY:  Clear to auscultation without rales, wheezing or rhonchi  ABDOMEN: Soft, non-tender, non-distended MUSCULOSKELETAL:  No edema; No deformity  SKIN: Warm and dry NEUROLOGIC:  Alert and oriented x 3  EKG:  EKG is ordered today. The ekg ordered Dec. 4, 2017  demonstrates   NSR at 61.  Normal ECG   Recent Labs: 11/14/2016: ALT 23; BUN 17; Creatinine, Ser 1.14; Hemoglobin 15.5; NT-Pro BNP 57; Platelets 281; Potassium 4.5; Sodium 138; TSH 3.200    Lipid Panel    Component Value Date/Time   CHOL 190 11/25/2015 0839   TRIG 99 11/25/2015 0839   HDL 51 11/25/2015 0839   CHOLHDL 3.7 11/25/2015 0839   VLDL 20 11/25/2015 0839   LDLCALC 119 11/25/2015 0839    Wt Readings from Last 3 Encounters:  04/04/17 216 lb 3.2 oz (98.1 kg)  11/27/16 213 lb (96.6 kg)  11/14/16 213 lb (96.6 kg)     Other studies Reviewed: Additional studies/ records that were reviewed today include: . Review of the above records demonstrates:    ASSESSMENT AND PLAN:    2. Mild chronic systolic CHF:   EF has normal  3. Essential HTN.:  BP is normal     Current medicines are reviewed at length with the patient today.  The patient does not have concerns regarding medicines.  The following changes have been made:  no change  Labs/ tests ordered today include:  No orders of the defined types were placed in this encounter.   Disposition:   FU with  Me in 6 months       Kristeen MissPhilip Illianna Paschal, MD  04/04/2017 3:23 PM    [facility] [address], [place], [place]  [address] Phone: [phone](336) [phone]; Fax: [phone]) [phone]

## 2017-04-13 ENCOUNTER — Other Ambulatory Visit: Payer: Self-pay | Admitting: Cardiovascular Disease

## 2017-04-16 NOTE — Telephone Encounter (Signed)
carvedilol (COREG) 6.25 MG tablet 60 tablet 6 10/02/2016    Sig - Route: Take 1 tablet (6.25 mg total) by mouth 2 (two) times daily. *Please call and schedule a six month follow up per last office visit* - Oral   Sent to pharmacy as: carvedilol (COREG) 6.25 MG tablet   E-Prescribing Status: Receipt confirmed by pharmacy (10/02/2016 8:39 AM EDT)    Rushie ChestnutWALGREENS

## 2017-05-14 ENCOUNTER — Other Ambulatory Visit: Payer: Self-pay | Admitting: Cardiovascular Disease

## 2017-05-15 NOTE — Telephone Encounter (Signed)
Pt's pharmacy requesting a refill on sildenafil 20 mg tablet. Would you like to refill this medication? Please advise °

## 2017-08-03 ENCOUNTER — Encounter: Payer: Self-pay | Admitting: Endocrinology

## 2017-08-03 ENCOUNTER — Ambulatory Visit: Payer: BLUE CROSS/BLUE SHIELD | Admitting: Endocrinology

## 2017-08-03 VITALS — BP 132/84 | HR 70 | Wt 206.0 lb

## 2017-08-03 DIAGNOSIS — E1029 Type 1 diabetes mellitus with other diabetic kidney complication: Secondary | ICD-10-CM

## 2017-08-03 LAB — POCT GLYCOSYLATED HEMOGLOBIN (HGB A1C): Hemoglobin A1C: 8.4

## 2017-08-03 MED ORDER — INSULIN ASPART 100 UNIT/ML FLEXPEN: PEN_INJECTOR | SUBCUTANEOUS | 11 refills | Status: DC

## 2017-08-03 MED ORDER — TOUJEO SOLOSTAR 300 UNIT/ML ~~LOC~~ SOPN: 60.0000 [IU] | PEN_INJECTOR | Freq: Every day | SUBCUTANEOUS | 11 refills | Status: DC

## 2017-08-03 NOTE — Progress Notes (Signed)
Subjective:    Patient ID: [name], male    DOB: [birth year], 52 y.o.   MRN: [id number]  HPI pt is referred by Dr Tiburcio Pea, for diabetes.  Pt states DM was dx'ed in 1996; he has mild neuropathy of the lower extremities, and associated renal insuff; he has been on insulin since soon after dx; pt says his diet is excellent, but exercise is poor; he has never had pancreatitis, pancreatic surgery, severe hypoglycemia or DKA.  He takes toujeo 60/d, and novolog 15-20 units 3 times a day (just before each meal).  He seldom has hypoglycemia, and these episodea re mild.  This happens before lunch.  It is highest at HS.   Past Medical History:  Diagnosis Date  . Arterial insufficiency (HCC)   . ED (erectile dysfunction)    DUE TO ARTERIAL INSUFFICIENCY  . Enlarged prostate without lower urinary tract symptoms (luts)   . HTN (hypertension)   . Insomnia   . Mixed hyperlipidemia   . Persistent proteinuria   . Primary insomnia   . Skin lesion   . 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 NO PAST SURGERIES      Social History   Socioeconomic History  . Marital status: Single    Spouse name: Not on file  . Number of children: Not on file  . Years of education: Not on file  . Highest education level: Not on file  Social Needs  . Financial resource strain: Not on file  . Food insecurity - worry: Not on file  . Food insecurity - inability: Not on file  . Transportation needs - medical: Not on file  . Transportation needs - non-medical: Not on file  Occupational History  . Not on file  Tobacco Use  . Smoking status: Never Smoker  . Smokeless tobacco: Never Used  Substance and Sexual Activity  . Alcohol use: Yes  . Drug use: No  . Sexual activity: Not on file  Other Topics Concern  . Not on file  Social History Narrative  . Not on file    Current Outpatient Medications on File Prior to Visit    Medication Sig Dispense Refill  . aspirin 81 MG tablet Take 81 mg by mouth daily.    Marland Kitchen BAYER CONTOUR NEXT TEST test strip as directed.  0  . carvedilol (COREG) 6.25 MG tablet TAKE 1 TABLET(6.25 MG) BY MOUTH TWICE DAILY. FOLLOW UP PER LAST OFFICE VISIT* 60 tablet 11  . ramipril (ALTACE) 5 MG capsule TAKE 1 CAPSULE(5 MG) BY MOUTH DAILY 30 capsule 10  . sildenafil (REVATIO) 20 MG tablet TAKE 5 TABLETS BY MOUTH AS NEEDED FOR SEXUAL ACTIVITY 50 tablet 3  . zolpidem (AMBIEN) 10 MG tablet Take 10 mg by mouth at bedtime as needed for sleep. Reported on 07/13/2015    . LEVEMIR FLEXTOUCH 100 UNIT/ML Pen Inject 52 Units as directed daily.  10   No current facility-administered medications on file prior to visit.     No Known Allergies  Family History  Problem Relation Age of Onset  . Breast cancer Mother   . Heart attack Father   . Hypertension Sister   . Diabetes Maternal Grandmother   . Stroke Maternal Grandmother   . Heart disease Maternal Grandfather   . Diabetes Paternal Grandmother   . Stroke Paternal Grandmother   . Hypertension Sister   . Hypertension Sister  BP 132/84 (BP Location: Left Arm, Patient Position: Sitting, Cuff Size: Normal)   Pulse 70   Wt 206 lb (93.4 kg)   SpO2 97%   BMI 28.73 kg/m     Review of Systems denies blurry vision, headache, chest pain, sob, n/v, urinary frequency, muscle cramps, excessive diaphoresis, depression, cold intolerance, rhinorrhea, and easy bruising. He hs lost 17 lbs x 3 mos.      Objective:   Physical Exam VS: see vs page GEN: no distress HEAD: head: no deformity eyes: no periorbital swelling, no proptosis external nose and ears are normal mouth: no lesion seen NECK: supple, thyroid is not enlarged CHEST WALL: no deformity LUNGS: clear to auscultation CV: reg rate and rhythm, no murmur ABD: abdomen is soft, nontender.  no hepatosplenomegaly.  not distended.  no hernia MUSCULOSKELETAL: muscle bulk and strength are grossly  normal.  no obvious joint swelling.  gait is normal and steady EXTEMITIES: no deformity.  no ulcer on the feet.  feet are of normal color and temp.  no edema.  There is bilateral onychomycosis of the toenails PULSES: dorsalis pedis intact bilat.  no carotid bruit NEURO:  cn 2-12 grossly intact.   readily moves all 4's.  sensation is intact to touch on the feet SKIN:  Normal texture and temperature.  No rash or suspicious lesion is visible.   NODES:  None palpable at the neck PSYCH: alert, well-oriented.  Does not appear anxious nor depressed.  Lab Results  Component Value Date   HGBA1C 8.4 08/03/2017   I have reviewed outside records, and summarized: Pt was noted to have elevated a1c, and referred here.  Other probs were addressed, including insomnia, HTN, and dyslipidemia.   Lab Results  Component Value Date   CREATININE 1.14 11/14/2016   BUN 17 11/14/2016   NA 138 11/14/2016   K 4.5 11/14/2016   CL 98 11/14/2016   CO2 23 11/14/2016      Assessment & Plan:  Type 1 DM, with polyneuropathy, new to me: he needs increased rx.    Patient Instructions  good diet and exercise significantly improve the control of your diabetes.  please let me know if you wish to be referred to a dietician.  high blood sugar is very risky to your health.  you should see an eye doctor and dentist every year.  It is very important to get all recommended vaccinations.  Controlling your blood pressure and cholesterol drastically reduces the damage diabetes does to your body.  Those who smoke should quit.  Please discuss these with your doctor.  check your blood sugar 4 times a day: before the 3 meals, and at bedtime.  also check if you have symptoms of your blood sugar being too high or too low.  please keep a record of the readings and bring it to your next appointment here (or you can bring the meter itself).  You can write it on any piece of paper.  please call us sooner if your blood sugar goes below 70, or  if you have a lot of readings over 200.  Please let us know if you want a pump and/or continuous glucose monitor.   For now, please change the insulins to the numbers listed below. Please call or message us in 1-2 weeks, to tell us how the blood sugar is doing.   Please come back for a follow-up appointment in 2 months.

## 2017-08-03 NOTE — Patient Instructions (Addendum)
good diet and exercise significantly improve the control of your diabetes.  please let me know if you wish to be referred to a dietician.  high blood sugar is very risky to your health.  you should see an eye doctor and dentist every year.  It is very important to get all recommended vaccinations.  Controlling your blood pressure and cholesterol drastically reduces the damage diabetes does to your body.  Those who smoke should quit.  Please discuss these with your doctor.  check your blood sugar 4 times a day: before the 3 meals, and at bedtime.  also check if you have symptoms of your blood sugar being too high or too low.  please keep a record of the readings and bring it to your next appointment here (or you can bring the meter itself).  You can write it on any piece of paper.  please call us sooner if your blood sugar goes below 70, or if you have a lot of readings over 200.  Please let us know if you want a pump and/or continuous glucose monitor.   For now, please change the insulins to the numbers listed below. Please call or message us in 1-2 weeks, to tell us how the blood sugar is doing.   Please come back for a follow-up appointment in 2 months.

## 2017-10-03 ENCOUNTER — Ambulatory Visit: Payer: BLUE CROSS/BLUE SHIELD | Admitting: Endocrinology

## 2017-11-01 ENCOUNTER — Other Ambulatory Visit: Payer: Self-pay | Admitting: Cardiovascular Disease

## 2017-11-22 ENCOUNTER — Encounter: Payer: Self-pay | Admitting: Cardiovascular Disease

## 2017-11-22 ENCOUNTER — Other Ambulatory Visit: Payer: BLUE CROSS/BLUE SHIELD | Admitting: *Deleted

## 2017-11-22 ENCOUNTER — Ambulatory Visit: Payer: BLUE CROSS/BLUE SHIELD | Admitting: Cardiovascular Disease

## 2017-11-22 VITALS — BP 135/80 | HR 58 | Ht 71.0 in | Wt 214.0 lb

## 2017-11-22 DIAGNOSIS — R5383 Other fatigue: Secondary | ICD-10-CM | POA: Diagnosis not present

## 2017-11-22 DIAGNOSIS — Z13220 Encounter for screening for lipoid disorders: Secondary | ICD-10-CM | POA: Diagnosis not present

## 2017-11-22 DIAGNOSIS — I5032 Chronic diastolic (congestive) heart failure: Secondary | ICD-10-CM | POA: Diagnosis not present

## 2017-11-22 DIAGNOSIS — I10 Essential (primary) hypertension: Secondary | ICD-10-CM

## 2017-11-22 LAB — LIPID PANEL
CHOL/HDL RATIO: 3.5 ratio (ref 0.0–5.0)
Cholesterol, Total: 195 mg/dL (ref 100–199)
HDL: 56 mg/dL (ref >39)
LDL Calculated: 124 mg/dL — ABNORMAL HIGH (ref 0–99)
TRIGLYCERIDES: 76 mg/dL (ref 0–149)
VLDL Cholesterol Cal: 15 mg/dL (ref 5–40)

## 2017-11-22 LAB — CBC WITH DIFFERENTIAL/PLATELET
BASOS ABS: 0 10*3/uL (ref 0.0–0.2)
BASOS: 0 %
EOS (ABSOLUTE): 0.1 10*3/uL (ref 0.0–0.4)
Eos: 1 %
HEMOGLOBIN: 15.1 g/dL (ref 13.0–17.7)
Hematocrit: 43.7 % (ref 37.5–51.0)
IMMATURE GRANS (ABS): 0 10*3/uL (ref 0.0–0.1)
Immature Granulocytes: 0 %
LYMPHS ABS: 2.6 10*3/uL (ref 0.7–3.1)
LYMPHS: 54 %
MCH: 31.4 pg (ref 26.6–33.0)
MCHC: 34.6 g/dL (ref 31.5–35.7)
MCV: 91 fL (ref 79–97)
MONOCYTES: 9 %
Monocytes Absolute: 0.5 10*3/uL (ref 0.1–0.9)
NEUTROS ABS: 1.7 10*3/uL (ref 1.4–7.0)
Neutrophils: 36 %
Platelets: 267 10*3/uL (ref 150–450)
RBC: 4.81 x10E6/uL (ref 4.14–5.80)
RDW: 12.6 % (ref 12.3–15.4)
WBC: 4.9 10*3/uL (ref 3.4–10.8)

## 2017-11-22 LAB — BASIC METABOLIC PANEL
BUN / CREAT RATIO: 15 (ref 9–20)
BUN: 20 mg/dL (ref 6–24)
CHLORIDE: 100 mmol/L (ref 96–106)
CO2: 25 mmol/L (ref 20–29)
Calcium: 9.6 mg/dL (ref 8.7–10.2)
Creatinine, Ser: 1.34 mg/dL — ABNORMAL HIGH (ref 0.76–1.27)
GFR calc non Af Amer: 60 mL/min/{1.73_m2} (ref >59)
GFR, EST AFRICAN AMERICAN: 69 mL/min/{1.73_m2} (ref >59)
Glucose: 195 mg/dL — ABNORMAL HIGH (ref 65–99)
POTASSIUM: 4.6 mmol/L (ref 3.5–5.2)
Sodium: 138 mmol/L (ref 134–144)

## 2017-11-22 LAB — TSH: TSH: 2.96 u[IU]/mL (ref 0.450–4.500)

## 2017-11-22 LAB — HEPATIC FUNCTION PANEL
ALBUMIN: 4.3 g/dL (ref 3.5–5.5)
ALT: 22 IU/L (ref 0–44)
AST: 20 IU/L (ref 0–40)
Alkaline Phosphatase: 59 IU/L (ref 39–117)
BILIRUBIN TOTAL: 0.3 mg/dL (ref 0.0–1.2)
BILIRUBIN, DIRECT: 0.1 mg/dL (ref 0.00–0.40)
Total Protein: 7 g/dL (ref 6.0–8.5)

## 2017-11-22 NOTE — Progress Notes (Signed)
Cardiology Office Note   Date:  11/22/2017   ID:  [name], DOB June 22, 1965, MRN [id number]  PCP:  Johny Blamer, MD  Cardiologist:   Kristeen Miss, MD   Chief Complaint  Patient presents with  . Congestive Heart Failure   Problem  1. Hyperlipidemia 2. Diabetes mellitus 3.  Hx of systolic CHF - improved now on meds.   [name] is a 52 y.o. male who presents for  Chest pain    3 months ago , he noticed shooting pains in his chest  He has some chest pain , Went to the ER ,  Work up was negative at that time Now, has CP  on a daily basis - several times a day Not Associated with dyspnea or sweats. Not brought on by exertion, taking a deep breath,  Nor change of position  Thinks that his sleeping position may affect this on occasion  These CP last for a few minutes. He describes this as a heaviness  + family hx of CAD - father and grandfather    Recently has been improving his diet.  Is restricting his carbs some now.    Was lifting weights regularly,  Walks on occasion. Stopped these exercises when the pain started.   Works as a Engineer, building services .   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    myoview did not show any ischemia but did show an EF of 44%. altace was increased to 5 mg a day  Is watching his salt.    Doing well.   BP is well controlled.   November 25, 2015:  [name] was seen today for follow up of his Hypertension and systolic congestive heart failure. Breathing much better.  Working out regularly .  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 ( insurance did not approve one of his diabetic meds.  bP and HR are well controlled.   Nov. 7, 2018::  [name] is doing well  Hx of CHF Not exercising , too tired, traveling too much 3-4 months - hes been traveling [place] to [place]  Avoiding  salt.     November 22, 2017: [name] is seen today for follow-up of his history of congestive heart failure and hypertension.  He has a history of diabetes mellitus. Still busy.  Not traveling as much.    Has some shortness of breath .     Past Medical History:  Diagnosis Date  . Arterial insufficiency (HCC)   . ED (erectile dysfunction)    DUE TO ARTERIAL INSUFFICIENCY  . Enlarged prostate without lower urinary tract symptoms (luts)   . HTN (hypertension)   . Insomnia   . Mixed hyperlipidemia   . Persistent proteinuria   . Primary insomnia   . Skin lesion   . Type 1 diabetes mellitus with other diabetic kidney complication [facility])     Past Surgical History:  Procedure Laterality Date  . NO PAST SURGERIES       Current Outpatient Medications  Medication Sig Dispense Refill  . aspirin 81 MG tablet Take 81 mg by mouth daily.    Marland Kitchen BAYER CONTOUR NEXT TEST test strip as directed.  0  . carvedilol (COREG) 6.25 MG tablet TAKE 1 TABLET(6.25 MG) BY MOUTH TWICE DAILY. FOLLOW UP PER LAST OFFICE VISIT* 60 tablet 11  . insulin  aspart (NOVOLOG FLEXPEN) 100 UNIT/ML FlexPen 3 times a day (just before each meal) 03-17-29 units, and pen needles 4/day 10 pen 11  . LEVEMIR FLEXTOUCH 100 UNIT/ML Pen Inject 52 Units as directed daily.  10  . ramipril (ALTACE) 5 MG capsule TAKE 1 CAPSULE(5 MG) BY MOUTH DAILY 30 capsule 4  . sildenafil (REVATIO) 20 MG tablet TAKE 5 TABLETS BY MOUTH AS NEEDED FOR SEXUAL ACTIVITY 50 tablet 3  . TOUJEO SOLOSTAR 300 UNIT/ML SOPN Inject 60 Units into the skin daily. 6 pen 11  . zolpidem (AMBIEN) 10 MG tablet Take 10 mg by mouth at bedtime as needed for sleep. Reported on 07/13/2015     No current facility-administered medications for this visit.     Allergies:   Patient has no known allergies.    Social History:  The patient  reports that he has never smoked. He has never used smokeless tobacco. He reports that he drinks alcohol. He reports that he does not use drugs.    Family History:  The patient's family history includes Breast cancer in his mother; Diabetes in his maternal grandmother and paternal grandmother; Heart attack in his father; Heart disease in his maternal grandfather; Hypertension in his sister, sister, and sister; Stroke in his maternal grandmother and paternal grandmother.    ROS:   Noted in current history, otherwise review of systems is negative.  Physical Exam: Blood pressure 135/80, pulse (!) 58, height 5\' 11"  (1.803 m), weight 214 lb (97.1 kg), SpO2 97 %.  GEN:  Well nourished, well developed in no acute distress HEENT: Normal NECK: No JVD; No carotid bruits LYMPHATICS: No lymphadenopathy CARDIAC: RRR , no murmurs, rubs, gallops RESPIRATORY:  Clear to auscultation without rales, wheezing or rhonchi  ABDOMEN: Soft, non-tender, non-distended MUSCULOSKELETAL:  No edema; No deformity  SKIN: Warm and dry NEUROLOGIC:  Alert and oriented x 3   EKG: November 22, 2017: Sinus bradycardia 58 beats minute.  Biatrial enlargement.  Nonspecific ST and T wave changes.  Recent Labs: No results found for requested labs within last 8760 hours.    Lipid Panel    Component Value Date/Time   CHOL 190 11/25/2015 0839   TRIG 99 11/25/2015 0839   HDL 51 11/25/2015 0839   CHOLHDL 3.7 11/25/2015 0839   VLDL 20 11/25/2015 0839   LDLCALC 119 11/25/2015 0839    Wt Readings from Last 3 Encounters:  11/22/17 214 lb (97.1 kg)  08/03/17 206 lb (93.4 kg)  04/04/17 216 lb 3.2 oz (98.1 kg)     Other studies Reviewed: Additional studies/ records that were reviewed today include: . Review of the above records demonstrates:    ASSESSMENT AND PLAN:    1.  Chronic diastolic congestive heart failure: [name] seems to be doing fairly well.  He is having some fatigue.  His lungs are clear and he does not have any signs or symptoms of heart failure.  Continue current medications.  2. Essential HTN.:  Blood pressure is well controlled.  Advised him to  work out a little bit more.  3.  Fatigue: He is having some fatigue but seems a little bit more than usual.  In addition to his lipid profile, liver enzymes and basic metabolic profile, will check a CBC and TSH.  We will forward this information to his primary medical doctor.      Current medicines are reviewed at length with the patient today.  The patient does not have concerns regarding medicines.  The following changes have  been made:  no change  Labs/ tests ordered today include:   Orders Placed This Encounter  Procedures  . CBC w/Diff  . TSH  . EKG 12-Lead     Disposition:   FU with  Me in 1 year      Kristeen Miss, MD  11/22/2017 9:27 AM    [facility] [address] [place], [place], [place]  [address] Phone: [phone]; Fax: [phone]

## 2017-11-22 NOTE — Patient Instructions (Addendum)
Medication Instructions: Your physician recommends that you continue on your current medications as directed. Please refer to the Current Medication list given to you today.  Labwork: Your physician has recommended that you have lab work today: BMET, CBC, TSH, Hepatic Function Panel, Lipid Panel  Procedures/Testing: None Ordered  Follow-Up: Your physician wants you to follow-up in: 1 YEAR with Dr. Elease HashimotoNahser. You will receive a reminder letter in the mail two months in advance. If you don't receive a letter, please call our office to schedule the follow-up appointment.   If you need a refill on your cardiac medications before your next appointment, please call your pharmacy.

## 2017-11-28 DIAGNOSIS — Q130 Coloboma of iris: Secondary | ICD-10-CM | POA: Diagnosis not present

## 2017-11-28 DIAGNOSIS — E119 Type 2 diabetes mellitus without complications: Secondary | ICD-10-CM | POA: Diagnosis not present

## 2017-11-28 DIAGNOSIS — H25012 Cortical age-related cataract, left eye: Secondary | ICD-10-CM | POA: Diagnosis not present

## 2017-11-28 DIAGNOSIS — H53022 Refractive amblyopia, left eye: Secondary | ICD-10-CM | POA: Diagnosis not present

## 2018-01-01 IMAGING — NM NM MISC PROCEDURE
6 series · 36 of 36 positions shown · non-contrast
Comparison: none

[Series 1: wbr_r-proj_st rest · 6.51mm/px · 6 of 64 frames shown]
[frame 6/64]
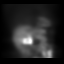
[frame 16/64]
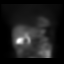
[frame 27/64]
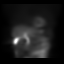
[frame 38/64]
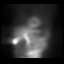
[frame 48/64]
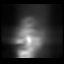
[frame 59/64]
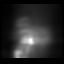

[Series 1: rest · 6.51mm/px · 6 of 64 frames shown]
[frame 6/64]
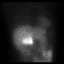
[frame 16/64]
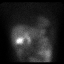
[frame 27/64]
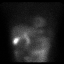
[frame 38/64]
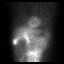
[frame 48/64]
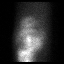
[frame 59/64]
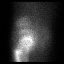

[Series 2: stress · 6.51mm/px · 6 of 512 frames shown (1 of 2)]
[frame 43/512]
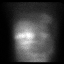
[frame 128/512]
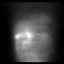
[frame 214/512]
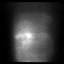
[frame 299/512]
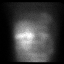
[frame 384/512]
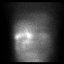
[frame 470/512]
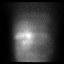

[Series 2: wbr_s-proj_st stress · 6.51mm/px · 6 of 512 frames shown (1 of 2)]
[frame 43/512]
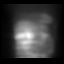
[frame 128/512]
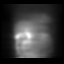
[frame 214/512]
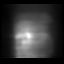
[frame 299/512]
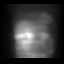
[frame 384/512]
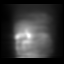
[frame 470/512]
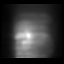

[Series 2: wbr_s-proj_st stress · 6.51mm/px · 6 of 64 frames shown (2 of 2)]
[frame 6/64]
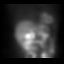
[frame 16/64]
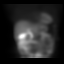
[frame 27/64]
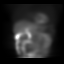
[frame 38/64]
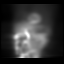
[frame 48/64]
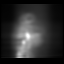
[frame 59/64]
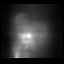

[Series 2: stress · 6.51mm/px · 6 of 64 frames shown (2 of 2)]
[frame 6/64]
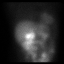
[frame 16/64]
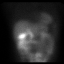
[frame 27/64]
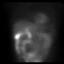
[frame 38/64]
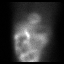
[frame 48/64]
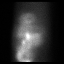
[frame 59/64]
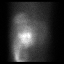

[36 of 36 positions shown; findings below may reference images not displayed]

Canned report from images found in remote index.

Refer to host system for actual result text.

## 2018-03-05 DIAGNOSIS — D229 Melanocytic nevi, unspecified: Secondary | ICD-10-CM | POA: Diagnosis not present

## 2018-03-05 DIAGNOSIS — D485 Neoplasm of uncertain behavior of skin: Secondary | ICD-10-CM | POA: Diagnosis not present

## 2018-04-11 ENCOUNTER — Other Ambulatory Visit: Payer: Self-pay | Admitting: Cardiovascular Disease

## 2018-05-06 DIAGNOSIS — M1990 Unspecified osteoarthritis, unspecified site: Secondary | ICD-10-CM | POA: Diagnosis not present

## 2018-05-10 ENCOUNTER — Other Ambulatory Visit: Payer: Self-pay | Admitting: Cardiovascular Disease

## 2018-05-13 DIAGNOSIS — M1711 Unilateral primary osteoarthritis, right knee: Secondary | ICD-10-CM | POA: Diagnosis not present

## 2018-05-13 DIAGNOSIS — M1712 Unilateral primary osteoarthritis, left knee: Secondary | ICD-10-CM | POA: Diagnosis not present

## 2018-05-20 DIAGNOSIS — M1712 Unilateral primary osteoarthritis, left knee: Secondary | ICD-10-CM | POA: Diagnosis not present

## 2018-05-20 DIAGNOSIS — M1711 Unilateral primary osteoarthritis, right knee: Secondary | ICD-10-CM | POA: Diagnosis not present

## 2018-05-24 ENCOUNTER — Other Ambulatory Visit: Payer: Self-pay | Admitting: Cardiovascular Disease

## 2018-05-27 NOTE — Telephone Encounter (Signed)
Pt's pharmacy is requesting a refill on sildenafil. Would Dr. Nahser like to refill this medication? Please address 

## 2018-05-31 DIAGNOSIS — M1711 Unilateral primary osteoarthritis, right knee: Secondary | ICD-10-CM | POA: Diagnosis not present

## 2018-05-31 DIAGNOSIS — M1712 Unilateral primary osteoarthritis, left knee: Secondary | ICD-10-CM | POA: Diagnosis not present

## 2018-06-19 DIAGNOSIS — Z1211 Encounter for screening for malignant neoplasm of colon: Secondary | ICD-10-CM | POA: Diagnosis not present

## 2018-06-19 DIAGNOSIS — K648 Other hemorrhoids: Secondary | ICD-10-CM | POA: Diagnosis not present

## 2018-06-19 DIAGNOSIS — D125 Benign neoplasm of sigmoid colon: Secondary | ICD-10-CM | POA: Diagnosis not present

## 2018-06-19 DIAGNOSIS — K5730 Diverticulosis of large intestine without perforation or abscess without bleeding: Secondary | ICD-10-CM | POA: Diagnosis not present

## 2018-06-19 DIAGNOSIS — K635 Polyp of colon: Secondary | ICD-10-CM | POA: Diagnosis not present

## 2018-06-28 DIAGNOSIS — M170 Bilateral primary osteoarthritis of knee: Secondary | ICD-10-CM | POA: Diagnosis not present

## 2018-07-11 DIAGNOSIS — M1711 Unilateral primary osteoarthritis, right knee: Secondary | ICD-10-CM | POA: Diagnosis not present

## 2018-08-03 ENCOUNTER — Other Ambulatory Visit: Payer: Self-pay | Admitting: Endocrinology

## 2018-08-03 NOTE — Telephone Encounter (Signed)
Please refill x 1 Ov is due  

## 2018-08-11 ENCOUNTER — Other Ambulatory Visit: Payer: Self-pay | Admitting: Endocrinology

## 2018-08-11 NOTE — Telephone Encounter (Signed)
Please refill x 1 Ov is due  

## 2018-08-14 DIAGNOSIS — E1169 Type 2 diabetes mellitus with other specified complication: Secondary | ICD-10-CM | POA: Diagnosis not present

## 2018-08-14 DIAGNOSIS — E1159 Type 2 diabetes mellitus with other circulatory complications: Secondary | ICD-10-CM | POA: Diagnosis not present

## 2018-08-14 DIAGNOSIS — E1129 Type 2 diabetes mellitus with other diabetic kidney complication: Secondary | ICD-10-CM | POA: Diagnosis not present

## 2018-08-14 DIAGNOSIS — E1165 Type 2 diabetes mellitus with hyperglycemia: Secondary | ICD-10-CM | POA: Diagnosis not present

## 2018-08-14 DIAGNOSIS — Z794 Long term (current) use of insulin: Secondary | ICD-10-CM | POA: Diagnosis not present

## 2018-08-14 DIAGNOSIS — R809 Proteinuria, unspecified: Secondary | ICD-10-CM | POA: Diagnosis not present

## 2018-08-21 DIAGNOSIS — Z125 Encounter for screening for malignant neoplasm of prostate: Secondary | ICD-10-CM | POA: Diagnosis not present

## 2018-08-21 DIAGNOSIS — Z0000 Encounter for general adult medical examination without abnormal findings: Secondary | ICD-10-CM | POA: Diagnosis not present

## 2018-08-21 DIAGNOSIS — Z23 Encounter for immunization: Secondary | ICD-10-CM | POA: Diagnosis not present

## 2018-08-30 DIAGNOSIS — E1165 Type 2 diabetes mellitus with hyperglycemia: Secondary | ICD-10-CM | POA: Diagnosis not present

## 2018-08-30 DIAGNOSIS — R809 Proteinuria, unspecified: Secondary | ICD-10-CM | POA: Diagnosis not present

## 2018-08-30 DIAGNOSIS — E1129 Type 2 diabetes mellitus with other diabetic kidney complication: Secondary | ICD-10-CM | POA: Diagnosis not present

## 2018-08-30 DIAGNOSIS — Z794 Long term (current) use of insulin: Secondary | ICD-10-CM | POA: Diagnosis not present

## 2018-10-01 DIAGNOSIS — E1165 Type 2 diabetes mellitus with hyperglycemia: Secondary | ICD-10-CM | POA: Diagnosis not present

## 2018-10-01 DIAGNOSIS — R809 Proteinuria, unspecified: Secondary | ICD-10-CM | POA: Diagnosis not present

## 2018-10-01 DIAGNOSIS — E1129 Type 2 diabetes mellitus with other diabetic kidney complication: Secondary | ICD-10-CM | POA: Diagnosis not present

## 2018-10-01 DIAGNOSIS — Z794 Long term (current) use of insulin: Secondary | ICD-10-CM | POA: Diagnosis not present

## 2018-10-31 ENCOUNTER — Other Ambulatory Visit: Payer: Self-pay | Admitting: Cardiovascular Disease

## 2018-11-08 ENCOUNTER — Telehealth: Payer: Self-pay | Admitting: *Deleted

## 2018-11-08 DIAGNOSIS — M170 Bilateral primary osteoarthritis of knee: Secondary | ICD-10-CM | POA: Diagnosis not present

## 2018-11-08 NOTE — Telephone Encounter (Signed)
   Primary Cardiologist:Philip Nahser, MD  Chart reviewed as part of pre-operative protocol coverage. Because of [name] past medical history and time since last visit, he will require a follow-up visit in order to better assess preoperative cardiovascular risk. Last OV is coming up on 1 year ago so would need to be in-person visit.  Pre-op covering staff: - Please schedule appointment and call patient to inform them. - Please contact requesting surgeon's office via preferred method (i.e, phone, fax) to inform them of need for appointment prior to surgery.  Do not see any prior hx of PCI so no need to route to MD regarding ASA. Provider can make that decision at time of OV. Per office protocol, the provider should assess clearance at time of office visit and should forward their finalized clearance decision to requesting party below. I will remove this message from the pre-op box.   Charlie Pitter, PA-C  11/08/2018, 1:34 PM

## 2018-11-08 NOTE — Telephone Encounter (Signed)
Call placed to pt re: surgical clearance and the need for an appt.  Pt has been scheduled to see [name], NP, in office visit, 11/14/2018 @ 8:00.    Will forward back to the requesting surgeon's office to make them aware.      COVID-19 Pre-Screening Questions:   PT ANSWERED NO TO ALL OF THE FOLLOWING QUESTIONS:  . In the past 7 to 10 days have you had a cough,  shortness of breath, headache, congestion, fever (100 or greater) body aches, chills, sore throat, or sudden loss of taste or sense of smell? . Have you been around anyone with known Covid 19. . Have you been around anyone who is awaiting Covid 19 test results in the past 7 to 10 days? . Have you been around anyone who has been exposed to Covid 19, or has mentioned symptoms of Covid 19 within the past 7 to 10 days?  If you have any concerns/questions about symptoms patients report during screening (either on the phone or at threshold). Contact the provider seeing the patient or DOD for further guidance.  If neither are available contact a member of the leadership team.

## 2018-11-08 NOTE — Telephone Encounter (Signed)
   [facility] Pre-operative Risk Assessment    Request for surgical clearance:  1. What type of surgery is being performed? JOINT REPLACEMENT  2. When is this surgery scheduled? TBD  3. What type of clearance is required (medical clearance vs. Pharmacy clearance to hold med vs. Both)? MEDICAL  4. Are there any medications that need to be held prior to surgery and how long? ASA   5. Practice name and name of physician performing surgery? [facility]; DR. DALLDORF  6. What is your office phone number [phone]   7.   What is your office fax number [phone]  8.   Anesthesia type (None, local, MAC, general) ? CALLED THE OFFICE TO VERIFY ANESTHESIA AND OFFICE WAS CLOSED.   [name] 11/08/2018, 1:09 PM  _________________________________________________________________   (provider comments below)

## 2018-11-13 NOTE — Progress Notes (Signed)
Cardiology Office Note   Date:  11/14/2018   ID:  Brian Boozeonovan Roseboom, DOB 08/26/1965, MRN [id number]  PCP:  Johny BlamerHarris, William, MD  Cardiologist:  Dr. Elease HashimotoNahser     Chief Complaint  Patient presents with  . Pre-op Exam      History of Present Illness: [name] is a 53 y.o. male who presents for pre-op eval with hx of CHF and HTN, DM-1, HLD   Chronic diastolic HF last seen 11/22/17.  Needs joint replacement Rt knee, ASA needs to be held, Dr Jerl Santosalldorf   Last nuc 11/27/16 with no ischemia, EF 46%  Normal EF and G2 DD on echo..    Today he has no chest pain or SOB.  His BP is controlled.  He is active and can climb 2 flights of steps without chest pain or SOB he works out 4-5 days a week. He has not been traveling and has decided to jobs closer to home, and take better care of himself.  His medical hx has not changed since his stress test and his HgbA1c is now 7.2.    Past Medical History:  Diagnosis Date  . Arterial insufficiency (HCC)   . ED (erectile dysfunction)    DUE TO ARTERIAL INSUFFICIENCY  . Enlarged prostate without lower urinary tract symptoms (luts)   . HTN (hypertension)   . Insomnia   . Mixed hyperlipidemia   . Persistent proteinuria   . Primary insomnia   . Skin lesion   . Type 1 diabetes mellitus with other diabetic kidney complication [facility](HCC)     Past Surgical History:  Procedure Laterality Date  . NO PAST SURGERIES       Current Outpatient Medications  Medication Sig Dispense Refill  . aspirin 81 MG tablet Take 81 mg by mouth daily.    Marland Kitchen. atorvastatin (LIPITOR) 40 MG tablet Take by mouth.    Marland Kitchen. BAYER CONTOUR NEXT TEST test strip as directed.  0  . carvedilol (COREG) 6.25 MG tablet TAKE 1 TABLET(6.25 MG) BY MOUTH TWICE DAILY. FOLLOW UP PER LAST OFFICE VISIT* 60 tablet 11  . NOVOLOG FLEXPEN 100 UNIT/ML FlexPen INJECT 10-30 UNITS THREE TIMES DAILY JUST BEFORE MEALS 5 pen 0  . ramipril (ALTACE) 5 MG capsule TAKE 1 CAPSULE(5 MG) BY MOUTH DAILY 30 capsule 1  .  sildenafil (REVATIO) 20 MG tablet TAKE 5 TABLETS BY MOUTH AS NEEDED FOR SEXUAL ACTIVITY 50 tablet 3  . TOUJEO SOLOSTAR 300 UNIT/ML SOPN INJECT 60 UNITS INTO THE SKIN DAILY 4.5 mL 0  . zolpidem (AMBIEN) 10 MG tablet Take 10 mg by mouth at bedtime as needed for sleep. Reported on 07/13/2015     No current facility-administered medications for this visit.     Allergies:   Patient has no known allergies.    Social History:  The patient  reports that he has never smoked. He has never used smokeless tobacco. He reports current alcohol use. He reports that he does not use drugs.   Family History:  The patient's family history includes Breast cancer in his mother; Diabetes in his maternal grandmother and paternal grandmother; Heart attack in his father; Heart disease in his maternal grandfather; Hypertension in his sister, sister, and sister; Stroke in his maternal grandmother and paternal grandmother.    ROS:  General:no colds or fevers, no weight changes Skin:no rashes or ulcers HEENT:no blurred vision, no congestion CV:see HPI PUL:see HPI GI:no diarrhea constipation or melena, no indigestion GU:no hematuria, no dysuria MS:no joint pain, no claudication  Neuro:no syncope, no lightheadedness Endo:type 1 diabetes well controlled., no thyroid disease  Wt Readings from Last 3 Encounters:  11/14/18 213 lb 12.8 oz (97 kg)  11/22/17 214 lb (97.1 kg)  08/03/17 206 lb (93.4 kg)     PHYSICAL EXAM: VS:  BP 134/88   Pulse 62   Ht 5\' 11"  (1.803 m)   Wt 213 lb 12.8 oz (97 kg)   SpO2 96%   BMI 29.82 kg/m  , BMI Body mass index is 29.82 kg/m. General:Pleasant affect, NAD Skin:Warm and dry, brisk capillary refill HEENT:normocephalic, sclera clear, mucus membranes moist Neck:supple, no JVD, no bruits  Heart:S1S2 RRR without murmur, gallup, rub or click Lungs:clear without rales, rhonchi, or wheezes TML:YYTK, non tender, + BS, do not palpate liver spleen or masses Ext:no lower ext edema, 2+  pedal pulses, 2+ radial pulses Neuro:alert and oriented X 3, MAE, follows commands, + facial symmetry    EKG:  EKG is ordered today. The ekg ordered today demonstrates SR LA enlargement and non specific T wave abnormality no changes since 11/12/17   Recent Labs: 11/22/2017: ALT 22; BUN 20; Creatinine, Ser 1.34; Hemoglobin 15.1; Platelets 267; Potassium 4.6; Sodium 138; TSH 2.960    Lipid Panel    Component Value Date/Time   CHOL 195 11/22/2017 0849   TRIG 76 11/22/2017 0849   HDL 56 11/22/2017 0849   CHOLHDL 3.5 11/22/2017 0849   CHOLHDL 3.7 11/25/2015 0839   VLDL 20 11/25/2015 0839   LDLCALC 124 (H) 11/22/2017 0849       Other studies Reviewed: Additional studies/ records that were reviewed today include:  Nuc study was neg for ischemia and decreased EF but echo with normal EF 2018  ASSESSMENT AND PLAN:  1.  Pre- op pt with need for Rt total knee low risk for cardiac occurrence for procedure.    2.  HTN controled  3.  DM-1 per PCP  4.  HLD will check labs today.     Current medicines are reviewed with the patient today.  The patient Has no concerns regarding medicines.  The following changes have been made:  See above Labs/ tests ordered today include:see above  Disposition:   FU:  see above  Signed, [name], NP  11/14/2018 8:12 AM    [facility] [address], [place], [place], [place] Phone: [phone]; Fax: [phone]

## 2018-11-14 ENCOUNTER — Encounter: Payer: Self-pay | Admitting: Cardiology

## 2018-11-14 ENCOUNTER — Ambulatory Visit (INDEPENDENT_AMBULATORY_CARE_PROVIDER_SITE_OTHER): Payer: BC Managed Care – PPO | Admitting: Cardiology

## 2018-11-14 ENCOUNTER — Other Ambulatory Visit: Payer: Self-pay

## 2018-11-14 VITALS — BP 134/88 | HR 62 | Ht 71.0 in | Wt 213.8 lb

## 2018-11-14 DIAGNOSIS — E782 Mixed hyperlipidemia: Secondary | ICD-10-CM | POA: Diagnosis not present

## 2018-11-14 DIAGNOSIS — I5032 Chronic diastolic (congestive) heart failure: Secondary | ICD-10-CM | POA: Diagnosis not present

## 2018-11-14 DIAGNOSIS — E1029 Type 1 diabetes mellitus with other diabetic kidney complication: Secondary | ICD-10-CM

## 2018-11-14 DIAGNOSIS — Z01818 Encounter for other preprocedural examination: Secondary | ICD-10-CM | POA: Diagnosis not present

## 2018-11-14 DIAGNOSIS — Z794 Long term (current) use of insulin: Secondary | ICD-10-CM | POA: Diagnosis not present

## 2018-11-14 DIAGNOSIS — R809 Proteinuria, unspecified: Secondary | ICD-10-CM | POA: Diagnosis not present

## 2018-11-14 DIAGNOSIS — E1165 Type 2 diabetes mellitus with hyperglycemia: Secondary | ICD-10-CM | POA: Diagnosis not present

## 2018-11-14 DIAGNOSIS — I10 Essential (primary) hypertension: Secondary | ICD-10-CM | POA: Diagnosis not present

## 2018-11-14 DIAGNOSIS — E1129 Type 2 diabetes mellitus with other diabetic kidney complication: Secondary | ICD-10-CM | POA: Diagnosis not present

## 2018-11-14 LAB — COMPREHENSIVE METABOLIC PANEL
ALT: 24 IU/L (ref 0–44)
AST: 20 IU/L (ref 0–40)
Albumin/Globulin Ratio: 1.8 (ref 1.2–2.2)
Albumin: 4.6 g/dL (ref 3.8–4.9)
Alkaline Phosphatase: 71 IU/L (ref 39–117)
BUN/Creatinine Ratio: 9 (ref 9–20)
BUN: 9 mg/dL (ref 6–24)
Bilirubin Total: 0.3 mg/dL (ref 0.0–1.2)
CO2: 26 mmol/L (ref 20–29)
Calcium: 10 mg/dL (ref 8.7–10.2)
Chloride: 100 mmol/L (ref 96–106)
Creatinine, Ser: 1.05 mg/dL (ref 0.76–1.27)
GFR calc Af Amer: 93 mL/min/{1.73_m2} (ref >59)
GFR calc non Af Amer: 81 mL/min/{1.73_m2} (ref >59)
Globulin, Total: 2.5 g/dL (ref 1.5–4.5)
Glucose: 191 mg/dL — ABNORMAL HIGH (ref 65–99)
Potassium: 4.8 mmol/L (ref 3.5–5.2)
Sodium: 138 mmol/L (ref 134–144)
Total Protein: 7.1 g/dL (ref 6.0–8.5)

## 2018-11-14 LAB — LIPID PANEL
Chol/HDL Ratio: 3.3 ratio (ref 0.0–5.0)
Cholesterol, Total: 183 mg/dL (ref 100–199)
HDL: 55 mg/dL (ref >39)
LDL Calculated: 109 mg/dL — ABNORMAL HIGH (ref 0–99)
Triglycerides: 96 mg/dL (ref 0–149)
VLDL Cholesterol Cal: 19 mg/dL (ref 5–40)

## 2018-11-14 NOTE — Patient Instructions (Signed)
Medication Instructions:  Your physician recommends that you continue on your current medications as directed. Please refer to the Current Medication list given to you today.  If you need a refill on your cardiac medications before your next appointment, please call your pharmacy.   Lab work: TODAY:  CMP & LIPID  If you have labs (blood work) drawn today and your tests are completely normal, you will receive your results only by: Marland Kitchen MyChart Message (if you have MyChart) OR . A paper copy in the mail If you have any lab test that is abnormal or we need to change your treatment, we will call you to review the results.  Testing/Procedures: None ordered   Follow-Up: At [facility], you and your health needs are our priority.  As part of our continuing mission to provide you with exceptional heart care, we have created designated Provider Care Teams.  These Care Teams include your primary Cardiologist (physician) and Advanced Practice Providers (APPs -  Physician Assistants and Nurse Practitioners) who all work together to provide you with the care you need, when you need it. You will need a follow up appointment in:  12 months.  Please call our office 2 months in advance to schedule this appointment.  You may see Mertie Moores, MD or one of the following Advanced Practice Providers on your designated Care Team: Richardson Dopp, PA-C [place], [place] . Daune Perch, NP  Any Other Special Instructions Will Be Listed Below (If Applicable).

## 2018-11-18 DIAGNOSIS — E1169 Type 2 diabetes mellitus with other specified complication: Secondary | ICD-10-CM | POA: Diagnosis not present

## 2018-11-18 DIAGNOSIS — E1129 Type 2 diabetes mellitus with other diabetic kidney complication: Secondary | ICD-10-CM | POA: Diagnosis not present

## 2018-11-18 DIAGNOSIS — E1165 Type 2 diabetes mellitus with hyperglycemia: Secondary | ICD-10-CM | POA: Diagnosis not present

## 2018-11-18 DIAGNOSIS — E1159 Type 2 diabetes mellitus with other circulatory complications: Secondary | ICD-10-CM | POA: Diagnosis not present

## 2018-11-22 ENCOUNTER — Telehealth: Payer: Self-pay | Admitting: *Deleted

## 2018-11-22 NOTE — Telephone Encounter (Signed)
Fax to the requesting office the 11/14/18 office note along with the clearance

## 2018-11-22 NOTE — Telephone Encounter (Signed)
   Primary Cardiologist: Mertie Moores, MD  Chart reviewed as part of pre-operative protocol coverage. Given past medical history and time since last visit, based on ACC/AHA guidelines, [name] would be at acceptable risk for the planned procedure without further cardiovascular testing. Patient was recently cleared by [name] NP.   I will route this recommendation to the requesting party via Epic fax function and remove from pre-op pool.  Please call with questions. Ok to hold aspirin for 5-7 days prior to surgery.   [place], [place] 11/22/2018, 2:53 PM

## 2018-11-22 NOTE — Telephone Encounter (Signed)
   [facility] Pre-operative Risk Assessment    Request for surgical clearance:  1. What type of surgery is being performed? JOINT REPLACEMENT; PT IS IN JOINT REPLACEMENT PROGRAM  2. When is this surgery scheduled? TBD  3. What type of clearance is required (medical clearance vs. Pharmacy clearance to hold med vs. Both)? MEDICAL  4. Are there any medications that need to be held prior to surgery and how long? ASA  5. Practice name and name of physician performing surgery? [facility]; DR. Collier Salina DALLDORF  6. What is your office phone number [phone]   7.   What is your office fax number [phone]  8.   Anesthesia type (None, local, MAC, general) ? GENERAL   Julaine Hua 11/22/2018, 2:14 PM  _________________________________________________________________   (provider comments below)

## 2018-11-22 NOTE — Telephone Encounter (Signed)
SURGEON REQUESTING AS STAT

## 2018-11-30 ENCOUNTER — Other Ambulatory Visit: Payer: Self-pay | Admitting: Cardiovascular Disease

## 2018-12-20 DIAGNOSIS — Z01812 Encounter for preprocedural laboratory examination: Secondary | ICD-10-CM | POA: Diagnosis not present

## 2018-12-20 DIAGNOSIS — M170 Bilateral primary osteoarthritis of knee: Secondary | ICD-10-CM | POA: Diagnosis not present

## 2018-12-30 ENCOUNTER — Other Ambulatory Visit: Payer: Self-pay | Admitting: Cardiovascular Disease

## 2018-12-31 ENCOUNTER — Ambulatory Visit: Payer: BLUE CROSS/BLUE SHIELD | Admitting: Cardiovascular Disease

## 2018-12-31 ENCOUNTER — Other Ambulatory Visit: Payer: BLUE CROSS/BLUE SHIELD

## 2019-02-17 DIAGNOSIS — R809 Proteinuria, unspecified: Secondary | ICD-10-CM | POA: Diagnosis not present

## 2019-02-17 DIAGNOSIS — E1129 Type 2 diabetes mellitus with other diabetic kidney complication: Secondary | ICD-10-CM | POA: Diagnosis not present

## 2019-02-17 DIAGNOSIS — Z794 Long term (current) use of insulin: Secondary | ICD-10-CM | POA: Diagnosis not present

## 2019-02-17 DIAGNOSIS — E1165 Type 2 diabetes mellitus with hyperglycemia: Secondary | ICD-10-CM | POA: Diagnosis not present

## 2019-02-17 DIAGNOSIS — Z23 Encounter for immunization: Secondary | ICD-10-CM | POA: Diagnosis not present

## 2019-02-18 DIAGNOSIS — R809 Proteinuria, unspecified: Secondary | ICD-10-CM | POA: Diagnosis not present

## 2019-02-18 DIAGNOSIS — E1165 Type 2 diabetes mellitus with hyperglycemia: Secondary | ICD-10-CM | POA: Diagnosis not present

## 2019-02-18 DIAGNOSIS — E1129 Type 2 diabetes mellitus with other diabetic kidney complication: Secondary | ICD-10-CM | POA: Diagnosis not present

## 2019-02-18 DIAGNOSIS — E1159 Type 2 diabetes mellitus with other circulatory complications: Secondary | ICD-10-CM | POA: Diagnosis not present

## 2019-02-18 DIAGNOSIS — Z794 Long term (current) use of insulin: Secondary | ICD-10-CM | POA: Diagnosis not present

## 2019-02-18 DIAGNOSIS — E1169 Type 2 diabetes mellitus with other specified complication: Secondary | ICD-10-CM | POA: Diagnosis not present

## 2019-02-18 DIAGNOSIS — E785 Hyperlipidemia, unspecified: Secondary | ICD-10-CM | POA: Diagnosis not present

## 2019-04-22 DIAGNOSIS — Z20828 Contact with and (suspected) exposure to other viral communicable diseases: Secondary | ICD-10-CM | POA: Diagnosis not present

## 2019-04-28 DIAGNOSIS — I10 Essential (primary) hypertension: Secondary | ICD-10-CM | POA: Diagnosis not present

## 2019-04-28 DIAGNOSIS — M7712 Lateral epicondylitis, left elbow: Secondary | ICD-10-CM | POA: Diagnosis not present

## 2019-04-28 DIAGNOSIS — E1029 Type 1 diabetes mellitus with other diabetic kidney complication: Secondary | ICD-10-CM | POA: Diagnosis not present

## 2019-04-28 DIAGNOSIS — E782 Mixed hyperlipidemia: Secondary | ICD-10-CM | POA: Diagnosis not present

## 2019-05-06 DIAGNOSIS — Z794 Long term (current) use of insulin: Secondary | ICD-10-CM | POA: Diagnosis not present

## 2019-05-06 DIAGNOSIS — I10 Essential (primary) hypertension: Secondary | ICD-10-CM | POA: Diagnosis not present

## 2019-05-06 DIAGNOSIS — M1711 Unilateral primary osteoarthritis, right knee: Secondary | ICD-10-CM | POA: Diagnosis not present

## 2019-05-06 DIAGNOSIS — E1029 Type 1 diabetes mellitus with other diabetic kidney complication: Secondary | ICD-10-CM | POA: Diagnosis not present

## 2019-05-09 ENCOUNTER — Other Ambulatory Visit: Payer: Self-pay | Admitting: Cardiovascular Disease

## 2019-06-09 ENCOUNTER — Other Ambulatory Visit: Payer: Self-pay | Admitting: Cardiovascular Disease

## 2019-06-09 MED ORDER — CARVEDILOL 6.25 MG PO TABS: 6.2500 mg | ORAL_TABLET | Freq: Two times a day (BID) | ORAL | 5 refills | Status: DC

## 2019-07-22 ENCOUNTER — Telehealth: Payer: Self-pay | Admitting: *Deleted

## 2019-07-22 NOTE — Telephone Encounter (Signed)
   Primary Cardiologist:Philip Nahser, MD  Chart reviewed as part of pre-operative protocol coverage. Because of [name] past medical history and time since last visit, he/she will require a follow-up visit in order to better assess preoperative cardiovascular risk.  Pre-op covering staff: - Please schedule appointment and call patient to inform them. - Please contact requesting surgeon's office via preferred method (i.e, phone, fax) to inform them of need for appointment prior to surgery.  If applicable, this message will also be routed to pharmacy pool and/or primary cardiologist for input on holding anticoagulant/antiplatelet agent as requested below so that this information is available at time of patient's appointment.   Nada Boozer, NP  07/22/2019, 5:21 PM

## 2019-07-22 NOTE — Telephone Encounter (Signed)
   [facility] Pre-operative Risk Assessment    Request for surgical clearance:  1. What type of surgery is being performed? RIGHT KNEE ARTHROPLASTY   2. When is this surgery scheduled? TBD   3. What type of clearance is required (medical clearance vs. Pharmacy clearance to hold med vs. Both)? MEDICAL  4. Are there any medications that need to be held prior to surgery and how long? ASA   5. Practice name and name of physician performing surgery? [facility]; DR. Collier Salina DALLDORF   6. What is your office phone number [phone]    7.   What is your office fax number [phone]  8.   Anesthesia type (None, local, MAC, general) ? SPINAL   Julaine Hua 07/22/2019, 4:07 PM  _________________________________________________________________   (provider comments below)

## 2019-07-23 NOTE — Telephone Encounter (Signed)
Pt has been scheduled to see Dr. Elease Hashimoto 07/28/2019 at 8:00.  Pt is aware to arrive at 7:45 for the screening process. Pt verbalized understanding and was very appreciative for the call.   Will route to the requesting surgeon's office to make them aware.

## 2019-07-27 ENCOUNTER — Encounter: Payer: Self-pay | Admitting: Cardiovascular Disease

## 2019-07-27 NOTE — Progress Notes (Signed)
Cardiology Office Note   Date:  07/28/2019   ID:  [name], DOB Apr 23, 1966, MRN [id number]  PCP:  Shirline Frees, MD  Cardiologist:   Mertie Moores, MD   Chief Complaint  Patient presents with  . Congestive Heart Failure  . Hypertension  . Hyperlipidemia   Problem  1. Hyperlipidemia 2. Diabetes mellitus 3.  Hx of systolic CHF - improved now on meds.   Sequoyah Counterman is a 54 y.o. male who presents for  Chest pain    3 months ago , he noticed shooting pains in his chest  He has some chest pain , Went to the ER ,  Work up was negative at that time Now, has CP  on a daily basis - several times a day Not Associated with dyspnea or sweats. Not brought on by exertion, taking a deep breath,  Nor change of position  Thinks that his sleeping position may affect this on occasion  These CP last for a few minutes. He describes this as a heaviness  + family hx of CAD - father and grandfather    Recently has been improving his diet.  Is restricting his carbs some now.    Was lifting weights regularly,  Walks on occasion. Stopped these exercises when the pain started.   Works as a Architect .   August 16, 2015:  Jessup was seen a month ago complaining of some chest pains.    myoview did not show any ischemia but did show an EF of 44%. altace was increased to 5 mg a day  Is watching his salt.    Doing well.   BP is well controlled.   November 25, 2015:  Tee was seen today for follow up of his Hypertension and systolic congestive heart failure. Breathing much better.  Working out regularly .  Works as a Personnel officer   Dec. 4, 2017:  Doing well from a cardiac standpoint Diabetes is not well controlled. ( insurance did not approve one of his diabetic meds.  bP and HR are well controlled.   Nov. 7, 2018::  Jontavious is doing well  Hx of CHF Not exercising , too tired, traveling too much 3-4 months - hes been  traveling [place] to [place]   Avoiding salt.     November 22, 2017: Michaeljohn is seen today for follow-up of his history of congestive heart failure and hypertension.  He has a history of diabetes mellitus. Still busy.  Not traveling as much.    Has some shortness of breath .    July 28, 2019   Dashton is seen today for follow up of his CHF,  DM HTN, and HLD and for pre-op evaluation for R knee replacement .  March 18.  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     Past Medical History:  Diagnosis Date  . Arterial insufficiency ([place])   . ED (erectile dysfunction)    DUE TO ARTERIAL INSUFFICIENCY  . Enlarged prostate without lower urinary tract symptoms (luts)   . HTN (hypertension)   . Insomnia   . Mixed hyperlipidemia   . Persistent proteinuria   . Primary insomnia   . Skin lesion   . Type 1 diabetes mellitus with other diabetic kidney complication [facility])     Past Surgical History:  Procedure Laterality Date  . NO PAST SURGERIES  Current Outpatient Medications  Medication Sig Dispense Refill  . aspirin 81 MG tablet Take 81 mg by mouth daily.    Marland Kitchen atorvastatin (LIPITOR) 40 MG tablet Take by mouth.    Marland Kitchen BAYER CONTOUR NEXT TEST test strip as directed.  0  . carvedilol (COREG) 6.25 MG tablet Take 1 tablet (6.25 mg total) by mouth 2 (two) times daily with a meal. 60 tablet 5  . Continuous Blood Gluc Sensor (FREESTYLE LIBRE 14 DAY SENSOR) MISC 1 applicator by Other route daily.    . insulin aspart (NOVOLOG FLEXPEN) 100 UNIT/ML FlexPen Inject 15 Units into the skin 3 (three) times daily.    . INVOKAMET XR 50-1000 MG TB24 Take 1 tablet by mouth daily.    . meloxicam (MOBIC) 15 MG tablet Take 15 mg by mouth as needed.    . ramipril (ALTACE) 5 MG capsule TAKE 1 CAPSULE(5 MG) BY MOUTH DAILY 30 capsule 9  . sildenafil (REVATIO) 20 MG tablet TAKE 5 TABLETS BY MOUTH AS NEEDED FOR SEXUAL ACTIVITY 50 tablet 3  . TOUJEO MAX SOLOSTAR 300  UNIT/ML SOPN Inject 10 Units into the skin daily.    Marland Kitchen zolpidem (AMBIEN) 10 MG tablet Take 10 mg by mouth at bedtime as needed for sleep. Reported on 07/13/2015     No current facility-administered medications for this visit.    Allergies:   Patient has no known allergies.    Social History:  The patient  reports that he has never smoked. He has never used smokeless tobacco. He reports current alcohol use. He reports that he does not use drugs.   Family History:  The patient's family history includes Breast cancer in his mother; Diabetes in his maternal grandmother and paternal grandmother; Heart attack in his father; Heart disease in his maternal grandfather; Hypertension in his sister, sister, and sister; Stroke in his maternal grandmother and paternal grandmother.    ROS:   Noted in current history, otherwise review of systems is negative.  Physical Exam:  Physical Exam: Blood pressure 126/74, pulse 62, height 5\' 11"  (1.803 m), weight 197 lb 12 oz (89.7 kg), SpO2 98 %.  GEN:  Well nourished, well developed in no acute distress HEENT: Normal NECK: No JVD; No carotid bruits LYMPHATICS: No lymphadenopathy CARDIAC: RRR , no murmurs, rubs, gallops RESPIRATORY:  Clear to auscultation without rales, wheezing or rhonchi  ABDOMEN: Soft, non-tender, non-distended MUSCULOSKELETAL:  No edema; No deformity  SKIN: Warm and dry NEUROLOGIC:  Alert and oriented x 3   EKG: July 28, 2019:  NSR at 62.  No ST or T wave changes.   Recent Labs: 07/28/2019: ALT 17; BUN 14; Creatinine, Ser 1.15; Potassium 4.5; Sodium 138    Lipid Panel    Component Value Date/Time   CHOL 122 07/28/2019 0832   TRIG 72 07/28/2019 0832   HDL 67 07/28/2019 0832   CHOLHDL 1.8 07/28/2019 0832   CHOLHDL 3.7 11/25/2015 0839   VLDL 20 11/25/2015 0839   LDLCALC 40 07/28/2019 0832    Wt Readings from Last 3 Encounters:  07/28/19 197 lb 12 oz (89.7 kg)  11/14/18 213 lb 12.8 oz (97 kg)  11/22/17 214 lb (97.1 kg)      Other studies Reviewed: Additional studies/ records that were reviewed today include: . Review of the above records demonstrates:    ASSESSMENT AND PLAN:    1.  Chronic diastolic congestive heart failure:  Is very well controlled.    He is doing very well.  No symptoms  He needs to have right knee replacement.  He is at low risk for his upcoming surgery.  Will be okay to hold his aspirin for 5 to 7 days prior to surgery if needed.  2. Essential HTN.:   Is active, has lost weight.   Doing well ,  No changes in meds.   3.  Hyperlipidemia: Continue atorvastatin.  Will check lipids, liver enzymes, basic metabolic profile today.   Current medicines are reviewed at length with the patient today.  The patient does not have concerns regarding medicines.  The following changes have been made:  no change  Labs/ tests ordered today include:   Orders Placed This Encounter  Procedures  . Lipid Profile  . Basic Metabolic Panel (BMET)  . Hepatic function panel  . EKG 12-Lead     Disposition:   FU with  Me in 1 year      Kristeen Miss, MD  07/28/2019 5:45 PM    [facility] [address] [place], [place], [place]  [address] Phone: [phone]; Fax: [phone]

## 2019-07-28 ENCOUNTER — Other Ambulatory Visit: Payer: Self-pay

## 2019-07-28 ENCOUNTER — Ambulatory Visit: Payer: BC Managed Care – PPO | Admitting: Cardiovascular Disease

## 2019-07-28 ENCOUNTER — Encounter: Payer: Self-pay | Admitting: Cardiovascular Disease

## 2019-07-28 VITALS — BP 126/74 | HR 62 | Ht 71.0 in | Wt 197.8 lb

## 2019-07-28 DIAGNOSIS — E1029 Type 1 diabetes mellitus with other diabetic kidney complication: Secondary | ICD-10-CM

## 2019-07-28 DIAGNOSIS — E782 Mixed hyperlipidemia: Secondary | ICD-10-CM | POA: Diagnosis not present

## 2019-07-28 DIAGNOSIS — I5032 Chronic diastolic (congestive) heart failure: Secondary | ICD-10-CM | POA: Diagnosis not present

## 2019-07-28 DIAGNOSIS — I10 Essential (primary) hypertension: Secondary | ICD-10-CM | POA: Diagnosis not present

## 2019-07-28 LAB — HEPATIC FUNCTION PANEL
ALT: 17 IU/L (ref 0–44)
AST: 16 IU/L (ref 0–40)
Albumin: 4.5 g/dL (ref 3.8–4.9)
Alkaline Phosphatase: 68 IU/L (ref 39–117)
Bilirubin Total: 0.6 mg/dL (ref 0.0–1.2)
Bilirubin, Direct: 0.19 mg/dL (ref 0.00–0.40)
Total Protein: 7.2 g/dL (ref 6.0–8.5)

## 2019-07-28 LAB — BASIC METABOLIC PANEL
BUN/Creatinine Ratio: 12 (ref 9–20)
BUN: 14 mg/dL (ref 6–24)
CO2: 25 mmol/L (ref 20–29)
Calcium: 10 mg/dL (ref 8.7–10.2)
Chloride: 100 mmol/L (ref 96–106)
Creatinine, Ser: 1.15 mg/dL (ref 0.76–1.27)
GFR calc Af Amer: 83 mL/min/{1.73_m2} (ref >59)
GFR calc non Af Amer: 72 mL/min/{1.73_m2} (ref >59)
Glucose: 194 mg/dL — ABNORMAL HIGH (ref 65–99)
Potassium: 4.5 mmol/L (ref 3.5–5.2)
Sodium: 138 mmol/L (ref 134–144)

## 2019-07-28 LAB — LIPID PANEL
Chol/HDL Ratio: 1.8 ratio (ref 0.0–5.0)
Cholesterol, Total: 122 mg/dL (ref 100–199)
HDL: 67 mg/dL (ref >39)
LDL Chol Calc (NIH): 40 mg/dL (ref 0–99)
Triglycerides: 72 mg/dL (ref 0–149)
VLDL Cholesterol Cal: 15 mg/dL (ref 5–40)

## 2019-07-28 NOTE — Patient Instructions (Signed)
Medication Instructions:  Your physician recommends that you continue on your current medications as directed. Please refer to the Current Medication list given to you today.  *If you need a refill on your cardiac medications before your next appointment, please call your pharmacy*  Lab Work: TODAY - cholesterol, liver panel, basic metabolic panel If you have labs (blood work) drawn today and your tests are completely normal, you will receive your results only by: . MyChart Message (if you have MyChart) OR . A paper copy in the mail If you have any lab test that is abnormal or we need to change your treatment, we will call you to review the results.   Testing/Procedures: None Ordered   Follow-Up: At [facility], you and your health needs are our priority.  As part of our continuing mission to provide you with exceptional heart care, we have created designated Provider Care Teams.  These Care Teams include your primary Cardiologist (physician) and Advanced Practice Providers (APPs -  Physician Assistants and Nurse Practitioners) who all work together to provide you with the care you need, when you need it.  Your next appointment:   1 year(s)  The format for your next appointment:   In Person  Provider:   You may see Philip Nahser, MD or one of the following Advanced Practice Providers on your designated Care Team:    Scott Weaver, PA-C  Vin Bhagat, PA-C  Janine Hammond, NP    

## 2019-08-01 DIAGNOSIS — E1129 Type 2 diabetes mellitus with other diabetic kidney complication: Secondary | ICD-10-CM | POA: Diagnosis not present

## 2019-08-01 DIAGNOSIS — E1029 Type 1 diabetes mellitus with other diabetic kidney complication: Secondary | ICD-10-CM | POA: Diagnosis not present

## 2019-08-01 DIAGNOSIS — I10 Essential (primary) hypertension: Secondary | ICD-10-CM | POA: Diagnosis not present

## 2019-08-01 DIAGNOSIS — M1711 Unilateral primary osteoarthritis, right knee: Secondary | ICD-10-CM | POA: Diagnosis not present

## 2019-08-01 DIAGNOSIS — Z794 Long term (current) use of insulin: Secondary | ICD-10-CM | POA: Diagnosis not present

## 2019-08-01 DIAGNOSIS — R809 Proteinuria, unspecified: Secondary | ICD-10-CM | POA: Diagnosis not present

## 2019-08-01 DIAGNOSIS — E1165 Type 2 diabetes mellitus with hyperglycemia: Secondary | ICD-10-CM | POA: Diagnosis not present

## 2019-08-04 DIAGNOSIS — M170 Bilateral primary osteoarthritis of knee: Secondary | ICD-10-CM | POA: Diagnosis not present

## 2019-09-02 ENCOUNTER — Other Ambulatory Visit: Payer: Self-pay | Admitting: Cardiovascular Disease

## 2019-11-04 DIAGNOSIS — E1165 Type 2 diabetes mellitus with hyperglycemia: Secondary | ICD-10-CM | POA: Diagnosis not present

## 2019-11-04 DIAGNOSIS — Z794 Long term (current) use of insulin: Secondary | ICD-10-CM | POA: Diagnosis not present

## 2019-11-04 DIAGNOSIS — R809 Proteinuria, unspecified: Secondary | ICD-10-CM | POA: Diagnosis not present

## 2019-11-04 DIAGNOSIS — I152 Hypertension secondary to endocrine disorders: Secondary | ICD-10-CM | POA: Diagnosis not present

## 2019-11-04 DIAGNOSIS — E1169 Type 2 diabetes mellitus with other specified complication: Secondary | ICD-10-CM | POA: Diagnosis not present

## 2019-11-04 DIAGNOSIS — E785 Hyperlipidemia, unspecified: Secondary | ICD-10-CM | POA: Diagnosis not present

## 2019-11-04 DIAGNOSIS — E1129 Type 2 diabetes mellitus with other diabetic kidney complication: Secondary | ICD-10-CM | POA: Diagnosis not present

## 2019-11-04 DIAGNOSIS — E1159 Type 2 diabetes mellitus with other circulatory complications: Secondary | ICD-10-CM | POA: Diagnosis not present

## 2019-11-05 ENCOUNTER — Other Ambulatory Visit: Payer: Self-pay

## 2019-11-05 MED ORDER — RAMIPRIL 5 MG PO CAPS: ORAL_CAPSULE | ORAL | 8 refills | Status: DC

## 2019-11-19 DIAGNOSIS — M170 Bilateral primary osteoarthritis of knee: Secondary | ICD-10-CM | POA: Diagnosis not present

## 2019-11-25 ENCOUNTER — Telehealth: Payer: Self-pay | Admitting: Cardiovascular Disease

## 2019-11-25 NOTE — Telephone Encounter (Signed)
New message      [facility] Pre-operative Risk Assessment    HEARTCARE STAFF: - Please ensure there is not already an duplicate clearance open for this procedure. - Under Visit Info/Reason for Call, type in Other and utilize the format Clearance MM/DD/YY or Clearance TBD. Do not use dashes or single digits. - If request is for dental extraction, please clarify the # of teeth to be extracted.  Request for surgical clearance:  1. What type of surgery is being performed? Right Knee Arthropasty  2. When is this surgery scheduled? 12/18/2019  3. What type of clearance is required (medical clearance vs. Pharmacy clearance to hold med vs. Both)? Both  4. Are there any medications that need to be held prior to surgery and how long?not sure what medications need to be withheld...please advise   5. Practice name and name of physician performing surgery? [facility], Dr. Melrose Nakayama   6. What is the office phone number? [phone]   7.   What is the office fax number? [phone]  8.   Anesthesia type (None, local, MAC, general) ? Spinal    [name] 11/25/2019, 4:01 PM  _________________________________________________________________   (provider comments below)

## 2019-11-25 NOTE — Telephone Encounter (Signed)
   Primary Cardiologist: Kristeen Miss, MD  Chart reviewed as part of pre-operative protocol coverage. Patient was contacted 11/25/2019 in reference to pre-operative risk assessment for pending surgery as outlined below.  [name] was last seen on 07/28/19 with hx HLD, HTN, DM, reported hx of systolic CHF. Last 2 echoes in 2017 and 2018 showed normal LVEF. Last nuc 11/2016 was clinically negative with normal perfusion but electrically positive, intermediate risk study. He was seen for pre-operative assessment 07/28/19 and was cleared for knee surgery at that time. He reports he is now having knee replacement surgery. He affirms he is doing well without any new cardiac symptoms. He just got done playing a round of golf. He is able to achieve over 4 METS without angina or dyspnea. Therefore, based on ACC/AHA guidelines, the patient would be at acceptable risk for the planned procedure without further cardiovascular testing. If surgeon deems that aspirin needs to be held, Dr. Elease Hashimoto has said it will be okay to hold his aspirin for 5 to 7 days prior to surgery if needed.  I will route this recommendation to the requesting party via Epic fax function and remove from pre-op pool. Please call with questions.  Laurann Montana, PA-C 11/25/2019, 4:56 PM

## 2019-12-05 ENCOUNTER — Other Ambulatory Visit: Payer: Self-pay

## 2019-12-05 MED ORDER — CARVEDILOL 6.25 MG PO TABS: 6.2500 mg | ORAL_TABLET | Freq: Two times a day (BID) | ORAL | 8 refills | Status: DC

## 2019-12-12 DIAGNOSIS — E1029 Type 1 diabetes mellitus with other diabetic kidney complication: Secondary | ICD-10-CM | POA: Diagnosis not present

## 2019-12-15 DIAGNOSIS — M1711 Unilateral primary osteoarthritis, right knee: Secondary | ICD-10-CM | POA: Diagnosis not present

## 2019-12-15 DIAGNOSIS — M1712 Unilateral primary osteoarthritis, left knee: Secondary | ICD-10-CM | POA: Diagnosis not present

## 2019-12-18 DIAGNOSIS — M1711 Unilateral primary osteoarthritis, right knee: Secondary | ICD-10-CM | POA: Diagnosis not present

## 2019-12-23 DIAGNOSIS — M1711 Unilateral primary osteoarthritis, right knee: Secondary | ICD-10-CM | POA: Diagnosis not present

## 2020-01-28 DIAGNOSIS — Z96651 Presence of right artificial knee joint: Secondary | ICD-10-CM | POA: Diagnosis not present

## 2020-01-28 DIAGNOSIS — Z9889 Other specified postprocedural states: Secondary | ICD-10-CM | POA: Diagnosis not present

## 2020-02-09 DIAGNOSIS — E1159 Type 2 diabetes mellitus with other circulatory complications: Secondary | ICD-10-CM | POA: Diagnosis not present

## 2020-02-09 DIAGNOSIS — E1129 Type 2 diabetes mellitus with other diabetic kidney complication: Secondary | ICD-10-CM | POA: Diagnosis not present

## 2020-02-09 DIAGNOSIS — E785 Hyperlipidemia, unspecified: Secondary | ICD-10-CM | POA: Diagnosis not present

## 2020-02-09 DIAGNOSIS — R809 Proteinuria, unspecified: Secondary | ICD-10-CM | POA: Diagnosis not present

## 2020-02-09 DIAGNOSIS — Z794 Long term (current) use of insulin: Secondary | ICD-10-CM | POA: Diagnosis not present

## 2020-02-09 DIAGNOSIS — E1165 Type 2 diabetes mellitus with hyperglycemia: Secondary | ICD-10-CM | POA: Diagnosis not present

## 2020-02-09 DIAGNOSIS — E1169 Type 2 diabetes mellitus with other specified complication: Secondary | ICD-10-CM | POA: Diagnosis not present

## 2020-02-09 DIAGNOSIS — I152 Hypertension secondary to endocrine disorders: Secondary | ICD-10-CM | POA: Diagnosis not present

## 2020-03-30 DIAGNOSIS — M25562 Pain in left knee: Secondary | ICD-10-CM | POA: Diagnosis not present

## 2020-04-02 DIAGNOSIS — Z471 Aftercare following joint replacement surgery: Secondary | ICD-10-CM | POA: Diagnosis not present

## 2020-04-02 DIAGNOSIS — M25562 Pain in left knee: Secondary | ICD-10-CM | POA: Diagnosis not present

## 2020-05-11 DIAGNOSIS — R809 Proteinuria, unspecified: Secondary | ICD-10-CM | POA: Diagnosis not present

## 2020-05-11 DIAGNOSIS — E1129 Type 2 diabetes mellitus with other diabetic kidney complication: Secondary | ICD-10-CM | POA: Diagnosis not present

## 2020-05-11 DIAGNOSIS — E1165 Type 2 diabetes mellitus with hyperglycemia: Secondary | ICD-10-CM | POA: Diagnosis not present

## 2020-05-11 DIAGNOSIS — Z794 Long term (current) use of insulin: Secondary | ICD-10-CM | POA: Diagnosis not present

## 2020-05-11 DIAGNOSIS — I152 Hypertension secondary to endocrine disorders: Secondary | ICD-10-CM | POA: Diagnosis not present

## 2020-05-11 DIAGNOSIS — E1159 Type 2 diabetes mellitus with other circulatory complications: Secondary | ICD-10-CM | POA: Diagnosis not present

## 2020-05-11 DIAGNOSIS — E785 Hyperlipidemia, unspecified: Secondary | ICD-10-CM | POA: Diagnosis not present

## 2020-05-11 DIAGNOSIS — E1169 Type 2 diabetes mellitus with other specified complication: Secondary | ICD-10-CM | POA: Diagnosis not present

## 2020-05-14 DIAGNOSIS — M1712 Unilateral primary osteoarthritis, left knee: Secondary | ICD-10-CM | POA: Diagnosis not present

## 2020-06-20 ENCOUNTER — Other Ambulatory Visit: Payer: Self-pay | Admitting: Cardiovascular Disease

## 2020-06-21 DIAGNOSIS — Z96651 Presence of right artificial knee joint: Secondary | ICD-10-CM | POA: Diagnosis not present

## 2020-08-09 DIAGNOSIS — R809 Proteinuria, unspecified: Secondary | ICD-10-CM | POA: Diagnosis not present

## 2020-08-09 DIAGNOSIS — E1165 Type 2 diabetes mellitus with hyperglycemia: Secondary | ICD-10-CM | POA: Diagnosis not present

## 2020-08-09 DIAGNOSIS — E1159 Type 2 diabetes mellitus with other circulatory complications: Secondary | ICD-10-CM | POA: Diagnosis not present

## 2020-08-09 DIAGNOSIS — E785 Hyperlipidemia, unspecified: Secondary | ICD-10-CM | POA: Diagnosis not present

## 2020-08-09 DIAGNOSIS — I152 Hypertension secondary to endocrine disorders: Secondary | ICD-10-CM | POA: Diagnosis not present

## 2020-08-09 DIAGNOSIS — E1129 Type 2 diabetes mellitus with other diabetic kidney complication: Secondary | ICD-10-CM | POA: Diagnosis not present

## 2020-08-09 DIAGNOSIS — Z794 Long term (current) use of insulin: Secondary | ICD-10-CM | POA: Diagnosis not present

## 2020-08-09 DIAGNOSIS — E1169 Type 2 diabetes mellitus with other specified complication: Secondary | ICD-10-CM | POA: Diagnosis not present

## 2020-08-23 ENCOUNTER — Encounter: Payer: Self-pay | Admitting: Cardiovascular Disease

## 2020-08-23 NOTE — Progress Notes (Signed)
Cardiology Office Note   Date:  08/24/2020   ID:  [name], DOB [birth year], MRN [id number]  PCP:  Johny Blamer, MD  Cardiologist:   Kristeen Miss, MD   Chief Complaint  Patient presents with  . Hypertension    c  . Congestive Heart Failure        Problem  1. Hyperlipidemia 2. Diabetes mellitus 3.  Hx of systolic CHF - improved now on meds.   [name] is a 55 y.o. male who presents for  Chest pain    3 months ago , he noticed shooting pains in his chest  He has some chest pain , Went to the ER ,  Work up was negative at that time Now, has CP  on a daily basis - several times a day Not Associated with dyspnea or sweats. Not brought on by exertion, taking a deep breath,  Nor change of position  Thinks that his sleeping position may affect this on occasion  These CP last for a few minutes. He describes this as a heaviness  + family hx of CAD - father and grandfather    Recently has been improving his diet.  Is restricting his carbs some now.    Was lifting weights regularly,  Walks on occasion. Stopped these exercises when the pain started.   Works as a Engineer, building services .   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    myoview did not show any ischemia but did show an EF of 44%. altace was increased to 5 mg a day  Is watching his salt.    Doing well.   BP is well controlled.   November 25, 2015:  [name] was seen today for follow up of his Hypertension and systolic congestive heart failure. Breathing much better.  Working out regularly .  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 ( insurance did not approve one of his diabetic meds.  bP and HR are well controlled.   Nov. 7, 2018::  [name] is doing well  Hx of CHF Not exercising , too tired, traveling too much 3-4 months - hes been  traveling [place] to [place]  Avoiding salt.     November 22, 2017: [name] is seen today for follow-up of his history of congestive heart failure and hypertension.  He has a history of diabetes mellitus. Still busy.  Not traveling as much.    Has some shortness of breath .    July 28, 2019   [name] is seen today for follow up of his CHF,  DM HTN, and HLD and for pre-op evaluation for R knee replacement .  March 18.  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   August 24, 2020: [name] is seen today for follow up of his CHF, DM, HTN, HLD  He had R knee replacement last year  Knee replacement went well  BP and HR look good No CP , no dyspnea   Has rare episode of palpitations , not associated with any chest pain or shortness of breath or dizziness.  It lasted for perhaps several seconds.   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  The labs  are in the [facility] system.  His total cholesterol is 101 Triglyceride levels 52 HDL is 58 LDL is 38.   Comprehensive metabolic profile shows a glucose of 137.  The remaining labs and the comprehensive panel are normal. Hemoglobin A1c is 7.2.  Past Medical History:  Diagnosis Date  . Arterial insufficiency ([facility])   . ED (erectile dysfunction)    DUE TO ARTERIAL INSUFFICIENCY  . Enlarged prostate without lower urinary tract symptoms (luts)   . HTN (hypertension)   . Insomnia   . Mixed hyperlipidemia   . Persistent proteinuria   . Primary insomnia   . Skin lesion   . Type 1 diabetes mellitus with other diabetic kidney complication [facility])     Past Surgical History:  Procedure Laterality Date  . NO PAST SURGERIES       Current Outpatient Medications  Medication Sig Dispense Refill  . aspirin 81 MG tablet Take 81 mg by mouth daily.    Marland Kitchen atorvastatin (LIPITOR) 40 MG tablet Take by mouth.    Marland Kitchen BAYER CONTOUR NEXT TEST test  strip as directed.  0  . colchicine 0.6 MG tablet Take 1 tablet by mouth as needed.    . Continuous Blood Gluc Sensor (FREESTYLE LIBRE 14 DAY SENSOR) MISC 1 applicator by Other route daily.    . insulin aspart (NOVOLOG FLEXPEN) 100 UNIT/ML FlexPen Inject 15 Units into the skin 3 (three) times daily.    . INVOKAMET XR [phone] MG TB24 Take 2 tablets by mouth daily.    . meloxicam (MOBIC) 15 MG tablet Take 15 mg by mouth as needed.    . sildenafil (REVATIO) 20 MG tablet TAKE 5 TABLETS BY MOUTH AS NEEDED FOR SEXUAL ACTIVITY 50 tablet 3  . TOUJEO MAX SOLOSTAR 300 UNIT/ML SOPN Inject 10 Units into the skin daily.    . TRULICITY 1.5 MG/0.5ML SOPN Inject 0.5 mLs into the skin once a week.    . zolpidem (AMBIEN) 10 MG tablet Take 10 mg by mouth at bedtime as needed for sleep. Reported on 07/13/2015    . carvedilol (COREG) 6.25 MG tablet Take 1 tablet (6.25 mg total) by mouth 2 (two) times daily with a meal. 180 tablet 3  . ramipril (ALTACE) 5 MG capsule Take 1 capsule (5 mg total) by mouth daily. 90 capsule 3   No current facility-administered medications for this visit.    Allergies:   Patient has no known allergies.    Social History:  The patient  reports that he has never smoked. He has never used smokeless tobacco. He reports current alcohol use. He reports that he does not use drugs.   Family History:  The patient's family history includes Breast cancer in his mother; Diabetes in his maternal grandmother and paternal grandmother; Heart attack in his father; Heart disease in his maternal grandfather; Hypertension in his sister, sister, and sister; Stroke in his maternal grandmother and paternal grandmother.    ROS:   Noted in current history, otherwise review of systems is negative.  Physical Exam: Blood pressure 116/80, pulse 66, height 5\' 11"  (1.803 m), weight 189 lb (85.7 kg), SpO2 98 %.  GEN:  Well nourished, well developed in no acute distress HEENT: Normal NECK: No JVD; No carotid  bruits LYMPHATICS: No lymphadenopathy CARDIAC: RRR , no murmurs, rubs, gallops RESPIRATORY:  Clear to auscultation without rales, wheezing or rhonchi  ABDOMEN: Soft, non-tender, non-distended MUSCULOSKELETAL:  No edema; No deformity  SKIN: Warm and dry NEUROLOGIC:  Alert and oriented x  3    EKG:     Recent Labs: No results found for requested labs within last 8760 hours.    Lipid Panel    Component Value Date/Time   CHOL 122 07/28/2019 0832   TRIG 72 07/28/2019 0832   HDL 67 07/28/2019 0832   CHOLHDL 1.8 07/28/2019 0832   CHOLHDL 3.7 11/25/2015 0839   VLDL 20 11/25/2015 0839   LDLCALC 40 07/28/2019 0832    Wt Readings from Last 3 Encounters:  08/24/20 189 lb (85.7 kg)  07/28/19 197 lb 12 oz (89.7 kg)  11/14/18 213 lb 12.8 oz (97 kg)     Other studies Reviewed: Additional studies/ records that were reviewed today include: . Review of the above records demonstrates:    ASSESSMENT AND PLAN:    1.  Chronic diastolic congestive heart failure:   Seems to be doing well.  Cont meds.   2. Essential HTN.:   BP is well controlled.   3.  Hyperlipidemia:   Labs look great.    Current medicines are reviewed at length with the patient today.  The patient does not have concerns regarding medicines.  The following changes have been made:  no change  Labs/ tests ordered today include:   Orders Placed This Encounter  Procedures  . EKG 12-Lead     Disposition:   FU with  Me in 1 year      Kristeen Miss, MD  08/24/2020 12:01 PM    [facility] [address] [place], [place], [place]  [address] Phone: [phone]; Fax: [phone]

## 2020-08-24 ENCOUNTER — Other Ambulatory Visit: Payer: Self-pay | Admitting: Cardiovascular Disease

## 2020-08-24 ENCOUNTER — Other Ambulatory Visit: Payer: Self-pay

## 2020-08-24 ENCOUNTER — Ambulatory Visit: Payer: BC Managed Care – PPO | Admitting: Cardiovascular Disease

## 2020-08-24 ENCOUNTER — Encounter: Payer: Self-pay | Admitting: Cardiovascular Disease

## 2020-08-24 VITALS — BP 116/80 | HR 66 | Ht 71.0 in | Wt 189.0 lb

## 2020-08-24 DIAGNOSIS — I5032 Chronic diastolic (congestive) heart failure: Secondary | ICD-10-CM | POA: Diagnosis not present

## 2020-08-24 MED ORDER — CARVEDILOL 6.25 MG PO TABS: 6.2500 mg | ORAL_TABLET | Freq: Two times a day (BID) | ORAL | 3 refills | Status: DC

## 2020-08-24 MED ORDER — RAMIPRIL 5 MG PO CAPS: 5.0000 mg | ORAL_CAPSULE | Freq: Every day | ORAL | 3 refills | Status: AC

## 2020-08-24 NOTE — Patient Instructions (Signed)

## 2020-12-10 DIAGNOSIS — E1165 Type 2 diabetes mellitus with hyperglycemia: Secondary | ICD-10-CM | POA: Diagnosis not present

## 2020-12-10 DIAGNOSIS — E1159 Type 2 diabetes mellitus with other circulatory complications: Secondary | ICD-10-CM | POA: Diagnosis not present

## 2020-12-10 DIAGNOSIS — Z794 Long term (current) use of insulin: Secondary | ICD-10-CM | POA: Diagnosis not present

## 2020-12-10 DIAGNOSIS — R809 Proteinuria, unspecified: Secondary | ICD-10-CM | POA: Diagnosis not present

## 2020-12-10 DIAGNOSIS — I152 Hypertension secondary to endocrine disorders: Secondary | ICD-10-CM | POA: Diagnosis not present

## 2020-12-10 DIAGNOSIS — E1129 Type 2 diabetes mellitus with other diabetic kidney complication: Secondary | ICD-10-CM | POA: Diagnosis not present

## 2021-01-06 ENCOUNTER — Other Ambulatory Visit: Payer: Self-pay | Admitting: Cardiovascular Disease

## 2021-01-06 DIAGNOSIS — H52223 Regular astigmatism, bilateral: Secondary | ICD-10-CM | POA: Diagnosis not present

## 2021-04-11 DIAGNOSIS — Z794 Long term (current) use of insulin: Secondary | ICD-10-CM | POA: Diagnosis not present

## 2021-04-11 DIAGNOSIS — E1129 Type 2 diabetes mellitus with other diabetic kidney complication: Secondary | ICD-10-CM | POA: Diagnosis not present

## 2021-04-11 DIAGNOSIS — R809 Proteinuria, unspecified: Secondary | ICD-10-CM | POA: Diagnosis not present

## 2021-04-11 DIAGNOSIS — Z23 Encounter for immunization: Secondary | ICD-10-CM | POA: Diagnosis not present

## 2021-04-11 DIAGNOSIS — I152 Hypertension secondary to endocrine disorders: Secondary | ICD-10-CM | POA: Diagnosis not present

## 2021-04-11 DIAGNOSIS — E1169 Type 2 diabetes mellitus with other specified complication: Secondary | ICD-10-CM | POA: Diagnosis not present

## 2021-04-11 DIAGNOSIS — E1159 Type 2 diabetes mellitus with other circulatory complications: Secondary | ICD-10-CM | POA: Diagnosis not present

## 2021-07-15 DIAGNOSIS — M1712 Unilateral primary osteoarthritis, left knee: Secondary | ICD-10-CM | POA: Diagnosis not present

## 2021-07-31 ENCOUNTER — Other Ambulatory Visit: Payer: Self-pay | Admitting: Cardiovascular Disease

## 2021-08-01 ENCOUNTER — Other Ambulatory Visit: Payer: Self-pay

## 2021-08-01 DIAGNOSIS — E1129 Type 2 diabetes mellitus with other diabetic kidney complication: Secondary | ICD-10-CM | POA: Diagnosis not present

## 2021-08-01 DIAGNOSIS — E785 Hyperlipidemia, unspecified: Secondary | ICD-10-CM | POA: Diagnosis not present

## 2021-08-01 DIAGNOSIS — Z794 Long term (current) use of insulin: Secondary | ICD-10-CM | POA: Diagnosis not present

## 2021-08-01 DIAGNOSIS — R809 Proteinuria, unspecified: Secondary | ICD-10-CM | POA: Diagnosis not present

## 2021-08-01 DIAGNOSIS — E1169 Type 2 diabetes mellitus with other specified complication: Secondary | ICD-10-CM | POA: Diagnosis not present

## 2021-08-01 MED ORDER — CARVEDILOL 6.25 MG PO TABS: 6.2500 mg | ORAL_TABLET | Freq: Two times a day (BID) | ORAL | 0 refills | Status: DC

## 2021-08-09 DIAGNOSIS — Z794 Long term (current) use of insulin: Secondary | ICD-10-CM | POA: Diagnosis not present

## 2021-08-09 DIAGNOSIS — I152 Hypertension secondary to endocrine disorders: Secondary | ICD-10-CM | POA: Diagnosis not present

## 2021-08-09 DIAGNOSIS — E1129 Type 2 diabetes mellitus with other diabetic kidney complication: Secondary | ICD-10-CM | POA: Diagnosis not present

## 2021-08-09 DIAGNOSIS — E785 Hyperlipidemia, unspecified: Secondary | ICD-10-CM | POA: Diagnosis not present

## 2021-08-09 DIAGNOSIS — E1169 Type 2 diabetes mellitus with other specified complication: Secondary | ICD-10-CM | POA: Diagnosis not present

## 2021-08-09 DIAGNOSIS — E1159 Type 2 diabetes mellitus with other circulatory complications: Secondary | ICD-10-CM | POA: Diagnosis not present

## 2021-08-09 DIAGNOSIS — R809 Proteinuria, unspecified: Secondary | ICD-10-CM | POA: Diagnosis not present

## 2021-09-22 ENCOUNTER — Ambulatory Visit: Payer: BC Managed Care – PPO | Admitting: Cardiovascular Disease

## 2021-09-22 ENCOUNTER — Encounter: Payer: Self-pay | Admitting: Cardiovascular Disease

## 2021-09-22 VITALS — BP 138/80 | HR 79 | Ht 71.0 in | Wt 189.6 lb

## 2021-09-22 DIAGNOSIS — R5383 Other fatigue: Secondary | ICD-10-CM | POA: Diagnosis not present

## 2021-09-22 DIAGNOSIS — I5032 Chronic diastolic (congestive) heart failure: Secondary | ICD-10-CM | POA: Diagnosis not present

## 2021-09-22 DIAGNOSIS — Z794 Long term (current) use of insulin: Secondary | ICD-10-CM

## 2021-09-22 DIAGNOSIS — E119 Type 2 diabetes mellitus without complications: Secondary | ICD-10-CM

## 2021-09-22 DIAGNOSIS — R079 Chest pain, unspecified: Secondary | ICD-10-CM

## 2021-09-22 MED ORDER — METOPROLOL TARTRATE 100 MG PO TABS: 100.0000 mg | ORAL_TABLET | Freq: Two times a day (BID) | ORAL | 0 refills | Status: DC

## 2021-09-22 MED ORDER — EMPAGLIFLOZIN 10 MG PO TABS: 10.0000 mg | ORAL_TABLET | Freq: Every day | ORAL | 3 refills | Status: DC

## 2021-09-22 NOTE — Patient Instructions (Addendum)
Medication Instructions:  ?START Jardiance 10mg  daily ?*If you need a refill on your cardiac medications before your next appointment, please call your pharmacy* ? ? ?Lab Work: ?TSH, BMET today ?If you have labs (blood work) drawn today and your tests are completely normal, you will receive your results only by: ?MyChart Message (if you have MyChart) OR ?A paper copy in the mail ?If you have any lab test that is abnormal or we need to change your treatment, we will call you to review the results. ? ? ?Testing/Procedures: ?ECHO ?Your physician has requested that you have an echocardiogram. Echocardiography is a painless test that uses sound waves to create images of your heart. It provides your doctor with information about the size and shape of your heart and how well your heart?s chambers and valves are working. This procedure takes approximately one hour. There are no restrictions for this procedure. ? ?Coronary CTA ?Your physician has requested that you have cardiac CT. Cardiac computed tomography (CT) is a painless test that uses an x-ray machine to take clear, detailed pictures of your heart. For further information please visit . Please follow instruction sheet as given. ? ?Follow-Up: ?At [facility], you and your health needs are our priority.  As part of our continuing mission to provide you with exceptional heart care, we have created designated Provider Care Teams.  These Care Teams include your primary Cardiologist (physician) and Advanced Practice Providers (APPs -  Physician Assistants and Nurse Practitioners) who all work together to provide you with the care you need, when you need it. ? ?Your next appointment:   ?6 month(s) ? ?The format for your next appointment:   ?In Person ? ?Provider:   ?[facility], MD  or Kristeen Miss, PA-C, Chelsea Aus, NP, or Eligha Bridegroom, PA-C      ? ?Other Instructions ? ? ?Your cardiac CT will be scheduled at:  ? ?[facility] ?[address] ?[place], [place] ?[phone] ? ?Please arrive at the [facility] Entrance (Entrance C2) of [facility] 30 minutes prior to test start time. ?You can use the FREE valet parking offered at entrance C (encouraged to control the heart rate for the test)  ?Proceed to the [facility] Radiology Department (first floor) to check-in and test prep. ? ?All radiology patients and guests should use entrance C2 at [facility], accessed from [facility], even though the hospital's physical address listed is [address] ? ? ? ?Please follow these instructions carefully (unless otherwise directed): ? ?Hold all erectile dysfunction medications at least 3 days (72 hrs) prior to test. ? ?On the Night Before the Test: ?Be sure to Drink plenty of water. ?Do not consume any caffeinated/decaffeinated beverages or chocolate 12 hours prior to your test. ?Do not take any antihistamines 12 hours prior to your test. ? ?On the Day of the Test: ?Drink plenty of water until 1 hour prior to the test. ?Do not eat any food 4 hours prior to the test. ?You may take your regular medications prior to the test.  ?Take metoprolol (Lopressor) two hours prior to test. ?     ?After the Test: ?Drink plenty of water. ?After receiving IV contrast, you may experience a mild flushed feeling. This is normal. ?On occasion, you may experience a mild rash up to 24 hours after the test. This is not dangerous. If this occurs, you can take Benadryl 25 mg and increase your fluid intake. ?  If you experience trouble breathing, this can be serious. If it is severe call 911 IMMEDIATELY. If it is mild, please call our office. ?If you take any of these medications: Glipizide/Metformin, Avandament, Glucavance, please do not take 48 hours after completing test unless otherwise instructed. ? ?We will call to schedule your test 2-4 weeks out understanding that some insurance companies will need an authorization  prior to the service being performed.  ? ?For non-scheduling related questions, please contact the cardiac imaging nurse navigator should you have any questions/concerns: ?Rockwell Alexandria, Cardiac Imaging Nurse Navigator ?Larey Brick, Cardiac Imaging Nurse Navigator ?[facility] ?Direct Office Dial: [phone]  ? ?For scheduling needs, including cancellations and rescheduling, please call [place], [phone]. ? ?  ? ?Important Information About Sugar ? ? ? ? ?  ?

## 2021-09-22 NOTE — Progress Notes (Signed)
? ?Cardiology Office Note ? ? ?Date:  09/22/2021  ? ?ID:  [name], DOB 1965/12/04, MRN [id number] ? ?PCP:  Shirline Frees, MD  ?Cardiologist:   Mertie Moores, MD  ? ?No chief complaint on file. ? ?Problem  ?1. Hyperlipidemia ?2. Diabetes mellitus ?3.  Hx of systolic CHF - improved now on meds.  ? ?[name] is a 56 y.o. male who presents for  Chest pain  ? ? ?3 months ago , he noticed shooting pains in his chest  ?He has some chest pain , ?Went to the ER ,  Work up was negative at that time ?Now, has CP  on a daily basis - several times a day ?Not Associated with dyspnea or sweats. ?Not brought on by exertion, taking a deep breath,  Nor change of position  ?Thinks that his sleeping position may affect this on occasion  ?These CP last for a few minutes. ?He describes this as a heaviness ? ?+ family hx of CAD - father and grandfather  ? ? ?Recently has been improving his diet.  Is restricting his carbs some now.   ? ?Was lifting weights regularly,  Walks on occasion. ?Stopped these exercises when the pain started.  ? 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  ? ?August 16, 2015: ? ?[name] was seen a month ago complaining of some chest pains.    myoview did not show any ischemia but did show an EF of 44%. ?altace was increased to 5 mg a day  ?Is watching his salt.    ?Doing well.   BP is well controlled.  ? ?November 25, 2015: ? ?[name] was seen today for follow up of his Hypertension and systolic congestive heart failure. ?Breathing much better.  Working out regularly .  ?Works as a Personnel officer  ? ?Dec. 4, 2017: ? ?Doing well from a cardiac standpoint ?Diabetes is not well controlled. ( insurance did not approve one of his diabetic meds.  ?bP and HR are well controlled.  ? ?Nov. 7, 2018:: ? ?[name] is doing well  ?Hx of CHF ?Not exercising , too tired, traveling too much ?3-4 months - [place]  ?Avoiding salt.    ? ?November 22, 2017: ?[name] is seen  today for follow-up of his history of congestive heart failure and hypertension.  He has a history of diabetes mellitus. ?Still busy.  Not traveling as much.    ?Has some shortness of breath .   ? ?July 28, 2019 ?  [name] is seen today for follow up of his CHF,  DM HTN, and HLD and for pre-op evaluation for R knee replacement .  March 18.  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  ? ?August 24, 2020: ?[name] is seen today for follow up of his CHF, DM, HTN, HLD  ?He had R knee replacement last year  ?Knee replacement went well  ?BP and HR look good ?No CP , no dyspnea  ? ?Has rare episode of palpitations , not associated with any chest pain or shortness of breath or dizziness.  It lasted for perhaps several seconds.   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 ?Had labs at his endocrinologist recently.  The labs are in the [facility] system.  His total cholesterol is 101 ?Triglyceride levels 52 ?HDL is 58 ?LDL is  38. ? ? ?Comprehensive metabolic profile shows a glucose of 137.  The remaining labs and the comprehensive panel are normal. ?Hemoglobin A1c is 7.2. ? ?September 22, 2021 ? ?[name] is seen today for follow-up of his congestive heart failure, diabetes mellitus, hypertension, hyperlipidemia. ? ?Perhaps a bit more fatigue than usual ?No angina ?Some chest discomfort ,  occurs randomly , not necessarily withexercise  ?Exercising twice a week  ?Has not been sleeping well ,  ?More caffeine today  ? ? ?Labs from his endocrinologist at [facility] show ?Total cholesterol 111, triglyceride level is 56, HDL is 48, LDL is 50  ? ?Complete metabolic profile shows a glucose level of 141.  Creatinine is 1.19.  Sodium is 139.  Potassium is 4.8. ? ? ? ? ?Past Medical History:  ?Diagnosis Date  ? Arterial insufficiency (HCC)   ? ED (erectile dysfunction)   ? DUE TO ARTERIAL INSUFFICIENCY  ? Enlarged prostate without lower urinary tract symptoms (luts)    ? HTN (hypertension)   ? Insomnia   ? Mixed hyperlipidemia   ? Persistent proteinuria   ? Primary insomnia   ? Skin lesion   ? 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 ? ?Past Surgical History:  ?Procedure Laterality Date  ? NO PAST SURGERIES    ? ? ? ?Current Outpatient Medications  ?Medication Sig Dispense Refill  ? atorvastatin (LIPITOR) 40 MG tablet Take by mouth.    ? BAYER CONTOUR NEXT TEST test strip as directed.  0  ? carvedilol (COREG) 6.25 MG tablet Take 1 tablet (6.25 mg total) by mouth 2 (two) times daily. Please make overdue appt with Dr Acie Fredrickson before anymore refills. Thank you 1st attempt 60 tablet 0  ? colchicine 0.6 MG tablet Take 1 tablet by mouth as needed.    ? Continuous Blood Gluc Sensor (FREESTYLE LIBRE 14 DAY SENSOR) MISC 1 applicator by Other route daily.    ? empagliflozin (JARDIANCE) 10 MG TABS tablet Take 1 tablet (10 mg total) by mouth daily before breakfast. 90 tablet 3  ? insulin aspart (NOVOLOG FLEXPEN) 100 UNIT/ML FlexPen Inject 15 Units into the skin 3 (three) times daily.    ? INVOKAMET XR 520 324 8820 MG TB24 Take 2 tablets by mouth daily.    ? metoprolol tartrate (LOPRESSOR) 100 MG tablet Take 1 tablet (100 mg total) by mouth 2 (two) times daily. 1 tablet 0  ? ramipril (ALTACE) 5 MG capsule Take 1 capsule (5 mg total) by mouth daily. 90 capsule 3  ? sildenafil (REVATIO) 20 MG tablet TAKE 5 TABLETS BY MOUTH AS NEEDED FOR SEXUAL ACTIVITY 100 tablet 2  ? TOUJEO MAX SOLOSTAR 300 UNIT/ML SOPN Inject 10 Units into the skin daily.    ? TRULICITY 1.5 0000000 SOPN Inject 0.5 mLs into the skin once a week.    ? zolpidem (AMBIEN) 10 MG tablet Take 10 mg by mouth at bedtime as needed for sleep. Reported on 07/13/2015    ? aspirin 81 MG tablet Take 81 mg by mouth daily. (Patient not taking: Reported on 09/22/2021)    ? meloxicam (MOBIC) 15 MG tablet Take 15 mg by mouth as needed. (Patient not taking: Reported on 09/22/2021)    ? ?No current facility-administered  medications for this visit.  ? ? ?Allergies:   Semaglutide  ? ? ?Social History:  The patient  reports that he has never smoked. He has never used smokeless tobacco. He reports current alcohol use. He reports that he  does not use drugs.  ? ?Family History:  The patient's family history includes Breast cancer in his mother; Diabetes in his maternal grandmother and paternal grandmother; Heart attack in his father; Heart disease in his maternal grandfather; Hypertension in his sister, sister, and sister; Stroke in his maternal grandmother and paternal grandmother.  ? ? ?ROS:   Noted in current history, otherwise review of systems is negative. ? ?Physical Exam: ?Blood pressure 138/80, pulse 79, height 5\' 11"  (1.803 m), weight 189 lb 9.6 oz (86 kg), SpO2 96 %. ? ?GEN:  Well nourished, well developed in no acute distress ?HEENT: Normal ?NECK: No JVD; No carotid bruits ?LYMPHATICS: No lymphadenopathy ?CARDIAC: RRR , no murmurs, rubs, gallops ?RESPIRATORY:  Clear to auscultation without rales, wheezing or rhonchi  ?ABDOMEN: Soft, non-tender, non-distended ?MUSCULOSKELETAL:  No edema; No deformity  ?SKIN: Warm and dry ?NEUROLOGIC:  Alert and oriented x 3 ? ? ?EKG:   September 22, 2021: Normal sinus rhythm at 79.  Biatrial enlargement.  ST wave inversions in leads II, III, aVF, V5 and V6.  The T wave inversions in leads V5 and V6 are new from previous EKGs. ? ?Recent Labs: ?No results found for requested labs within last 8760 hours.  ? ? ?Lipid Panel ?   ?Component Value Date/Time  ? CHOL 122 07/28/2019 0832  ? TRIG 72 07/28/2019 0832  ? HDL 67 07/28/2019 0832  ? CHOLHDL 1.8 07/28/2019 0832  ? CHOLHDL 3.7 11/25/2015 0839  ? VLDL 20 11/25/2015 0839  ? [place] 40 07/28/2019 0832  ? ? ?Wt Readings from Last 3 Encounters:  ?09/22/21 189 lb 9.6 oz (86 kg)  ?08/24/20 189 lb (85.7 kg)  ?07/28/19 197 lb 12 oz (89.7 kg)  ?  ? ?Other studies Reviewed: ?Additional studies/ records that were reviewed today include: . ?Review of the above  records demonstrates:  ? ? ?ASSESSMENT AND PLAN: ?  ? ?1.  Chronic diastolic congestive heart failure:   [name] has a history of chronic diastolic congestive heart failure.  He has a history of diabetes a

## 2021-09-23 LAB — BASIC METABOLIC PANEL
BUN/Creatinine Ratio: 15 (ref 9–20)
BUN: 19 mg/dL (ref 6–24)
CO2: 23 mmol/L (ref 20–29)
Calcium: 10.2 mg/dL (ref 8.7–10.2)
Chloride: 99 mmol/L (ref 96–106)
Creatinine, Ser: 1.24 mg/dL (ref 0.76–1.27)
Glucose: 107 mg/dL — ABNORMAL HIGH (ref 70–99)
Potassium: 4.9 mmol/L (ref 3.5–5.2)
Sodium: 142 mmol/L (ref 134–144)
eGFR: 68 mL/min/{1.73_m2} (ref >59)

## 2021-09-23 LAB — TSH: TSH: 3.9 u[IU]/mL (ref 0.450–4.500)

## 2021-09-26 ENCOUNTER — Other Ambulatory Visit: Payer: Self-pay | Admitting: Cardiovascular Disease

## 2021-09-30 ENCOUNTER — Encounter: Payer: Self-pay | Admitting: Cardiovascular Disease

## 2021-09-30 NOTE — Telephone Encounter (Signed)
Called and spoke to patient since we have Invokamet on his profile. States he will speak with his endocrinologist, but he believes that he was to stop this and stop the Syngardy XR. I have updated med list at this time. ?

## 2021-10-04 ENCOUNTER — Ambulatory Visit (HOSPITAL_COMMUNITY): Payer: BC Managed Care – PPO | Attending: Cardiology

## 2021-10-04 DIAGNOSIS — I5032 Chronic diastolic (congestive) heart failure: Secondary | ICD-10-CM | POA: Insufficient documentation

## 2021-10-04 DIAGNOSIS — R079 Chest pain, unspecified: Secondary | ICD-10-CM | POA: Insufficient documentation

## 2021-10-04 DIAGNOSIS — Z794 Long term (current) use of insulin: Secondary | ICD-10-CM | POA: Diagnosis not present

## 2021-10-04 DIAGNOSIS — E119 Type 2 diabetes mellitus without complications: Secondary | ICD-10-CM | POA: Diagnosis not present

## 2021-10-04 LAB — ECHOCARDIOGRAM COMPLETE
Area-P 1/2: 2.81 cm2
S' Lateral: 2.7 cm

## 2021-10-13 ENCOUNTER — Telehealth (HOSPITAL_COMMUNITY): Payer: Self-pay | Admitting: *Deleted

## 2021-10-13 NOTE — Telephone Encounter (Signed)
Attempted to call patient regarding upcoming cardiac CT appointment. °Left message on voicemail with name and callback number ° °Asencion Guisinger RN Navigator Cardiac Imaging °[facility] °[phone] Office °[phone] Cell ° °

## 2021-10-14 ENCOUNTER — Telehealth (HOSPITAL_COMMUNITY): Payer: Self-pay | Admitting: *Deleted

## 2021-10-14 NOTE — Telephone Encounter (Signed)
Reaching out to patient to offer assistance regarding upcoming cardiac imaging study; pt verbalizes understanding of appt date/time, parking situation and where to check in, pre-test NPO status and medications ordered, and verified current allergies; name and call back number provided for further questions should they arise  Larey Brick RN Navigator Cardiac Imaging [facility] [phone] office [phone] cell  Patient to take 100mg  metoprolol tartrate two hours prior to his cardiac scan. He is aware to arrive at 8am.

## 2021-10-17 ENCOUNTER — Other Ambulatory Visit: Payer: Self-pay | Admitting: Cardiovascular Disease

## 2021-10-17 ENCOUNTER — Ambulatory Visit (HOSPITAL_COMMUNITY)
Admission: RE | Admit: 2021-10-17 | Discharge: 2021-10-17 | Disposition: A | Discharge status: Home / Self Care | Payer: BC Managed Care – PPO | Source: Ambulatory Visit | Attending: Cardiovascular Disease | Admitting: Cardiovascular Disease

## 2021-10-17 ENCOUNTER — Ambulatory Visit (HOSPITAL_BASED_OUTPATIENT_CLINIC_OR_DEPARTMENT_OTHER)
Admission: RE | Admit: 2021-10-17 | Discharge: 2021-10-17 | Disposition: A | Discharge status: Home / Self Care | Payer: BC Managed Care – PPO | Source: Ambulatory Visit | Attending: Cardiovascular Disease | Admitting: Cardiovascular Disease

## 2021-10-17 DIAGNOSIS — R079 Chest pain, unspecified: Secondary | ICD-10-CM | POA: Diagnosis not present

## 2021-10-17 DIAGNOSIS — I2510 Atherosclerotic heart disease of native coronary artery without angina pectoris: Secondary | ICD-10-CM

## 2021-10-17 DIAGNOSIS — R931 Abnormal findings on diagnostic imaging of heart and coronary circulation: Secondary | ICD-10-CM | POA: Insufficient documentation

## 2021-10-17 MED ORDER — NITROGLYCERIN 0.4 MG SL SUBL
SUBLINGUAL_TABLET | SUBLINGUAL | Status: AC
  Filled 2021-10-17: qty 2

## 2021-10-17 MED ORDER — IOHEXOL 350 MG/ML SOLN
100.0000 mL | Freq: Once | INTRAVENOUS | Status: AC | PRN
  Administered 2021-10-17: 100 mL via INTRAVENOUS

## 2021-10-17 MED ORDER — NITROGLYCERIN 0.4 MG SL SUBL
0.8000 mg | SUBLINGUAL_TABLET | Freq: Once | SUBLINGUAL | Status: AC
  Administered 2021-10-17: 0.8 mg via SUBLINGUAL

## 2021-10-17 NOTE — Progress Notes (Signed)
CT FFR ordered.   Gerri Spore T. Flora Lipps, MD, [facility]  [facility]  [address] [place], [place] [address] [phone]  11:27 AM

## 2021-11-15 ENCOUNTER — Other Ambulatory Visit: Payer: Self-pay

## 2021-11-25 ENCOUNTER — Other Ambulatory Visit: Payer: Self-pay | Admitting: *Deleted

## 2021-11-25 MED ORDER — CARVEDILOL 6.25 MG PO TABS: 6.2500 mg | ORAL_TABLET | Freq: Two times a day (BID) | ORAL | 3 refills | Status: DC

## 2021-12-19 ENCOUNTER — Telehealth: Payer: Self-pay | Admitting: *Deleted

## 2021-12-19 NOTE — Chronic Care Management (AMB) (Signed)
  Care Coordination  Note  12/19/2021 Name: [name] MRN: [id number] DOB: 1966-02-26  [name] is a 56 y.o. year old male who is a primary care patient of Johny Blamer, MD. I reached out to [name] by phone today to offer care coordination services.       Follow up plan: Unsuccessful telephone outreach attempt made. A HIPAA compliant phone message was left for the patient providing contact information and requesting a return call.  The care guide will reach out to the patient again over the next 7 days.  If patient calls provider office to request assistance with care coordination needs, please contact the care guide at the number below.   [facility]  Care Coordination Care Guide  Direct Dial: [phone]

## 2022-01-09 NOTE — Chronic Care Management (AMB) (Unsigned)
  Care Coordination  Outreach Note  01/09/2022 Name: Bryon Parker MRN: [id number] DOB: 06-Aug-1965   Care Coordination Outreach Attempts  A second unsuccessful outreach was attempted today to offer the patient with information about available care coordination services as a benefit of their health plan.     Follow Up Plan:  Additional outreach attempts will be made to offer the patient care coordination information and services.   Encounter Outcome:  No Answer   Gwenevere Ghazi  Care Coordination Care Guide  Direct Dial: [phone]

## 2022-01-12 NOTE — Chronic Care Management (AMB) (Signed)
  Care Coordination  Outreach Note  01/12/2022 Name: [name] MRN: [id number] DOB: 1965/06/21   Care Coordination Outreach Attempts  A third unsuccessful outreach was attempted today to offer the patient with information about available care coordination services as a benefit of their health plan.   Follow Up Plan:  No further outreach attempts will be made at this time. We have been unable to contact the patient to offer or enroll patient in care coordination services  Encounter Outcome:  No Answer  Gwenevere Ghazi  Care Coordination Care Guide  Direct Dial: [phone]

## 2022-03-07 DIAGNOSIS — Z23 Encounter for immunization: Secondary | ICD-10-CM | POA: Diagnosis not present

## 2022-03-07 DIAGNOSIS — Z125 Encounter for screening for malignant neoplasm of prostate: Secondary | ICD-10-CM | POA: Diagnosis not present

## 2022-03-07 DIAGNOSIS — E1029 Type 1 diabetes mellitus with other diabetic kidney complication: Secondary | ICD-10-CM | POA: Diagnosis not present

## 2022-03-07 DIAGNOSIS — Z0000 Encounter for general adult medical examination without abnormal findings: Secondary | ICD-10-CM | POA: Diagnosis not present

## 2022-03-09 DIAGNOSIS — H04123 Dry eye syndrome of bilateral lacrimal glands: Secondary | ICD-10-CM | POA: Diagnosis not present

## 2022-03-09 DIAGNOSIS — H2513 Age-related nuclear cataract, bilateral: Secondary | ICD-10-CM | POA: Diagnosis not present

## 2022-03-09 DIAGNOSIS — E113212 Type 2 diabetes mellitus with mild nonproliferative diabetic retinopathy with macular edema, left eye: Secondary | ICD-10-CM | POA: Diagnosis not present

## 2022-03-09 DIAGNOSIS — H53022 Refractive amblyopia, left eye: Secondary | ICD-10-CM | POA: Diagnosis not present

## 2022-03-26 ENCOUNTER — Encounter: Payer: Self-pay | Admitting: Cardiovascular Disease

## 2022-03-26 NOTE — Progress Notes (Unsigned)
Cardiology Office Note   Date:  03/27/2022   ID:  [name], DOB [birth year], MRN [id number]  PCP:  Shirline Frees, MD  Cardiologist:   Mertie Moores, MD   Chief Complaint  Patient presents with   Congestive Heart Failure   Hypertension        Problem  1. Hyperlipidemia 2. Diabetes mellitus 3.  Hx of systolic CHF - improved now on meds.   [name] is a 56 y.o. male who presents for  Chest pain    3 months ago , he noticed shooting pains in his chest  He has some chest pain , Went to the ER ,  Work up was negative at that time Now, has CP  on a daily basis - several times a day Not Associated with dyspnea or sweats. Not brought on by exertion, taking a deep breath,  Nor change of position  Thinks that his sleeping position may affect this on occasion  These CP last for a few minutes. He describes this as a heaviness  + family hx of CAD - father and grandfather    Recently has been improving his diet.  Is restricting his carbs some now.    Was lifting weights regularly,  Walks on occasion. Stopped these exercises when the pain started.   Works as a Architect .   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    myoview did not show any ischemia but did show an EF of 44%. altace was increased to 5 mg a day  Is watching his salt.    Doing well.   BP is well controlled.   November 25, 2015:  [name] was seen today for follow up of his Hypertension and systolic congestive heart failure. Breathing much better.  Working out regularly .  Works as a Personnel officer   Dec. 4, 2017:  Doing well from a cardiac standpoint Diabetes is not well controlled. ( insurance did not approve one of his diabetic meds.  bP and HR are well controlled.   Nov. 7, 2018::  [name] is doing well  Hx of CHF Not exercising , too tired, traveling too much 3-4 months - hes been traveling [place]  to [place]   Avoiding salt.     November 22, 2017: [name] is seen today for follow-up of his history of congestive heart failure and hypertension.  He has a history of diabetes mellitus. Still busy.  Not traveling as much.    Has some shortness of breath .    July 28, 2019   [name] is seen today for follow up of his CHF,  DM HTN, and HLD and for pre-op evaluation for R knee replacement .  March 18.  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   August 24, 2020: [name] is seen today for follow up of his CHF, DM, HTN, HLD  He had R knee replacement last year  Knee replacement went well  BP and HR look good No CP , no dyspnea   Has rare episode of palpitations , not associated with any chest pain or shortness of breath or dizziness.  It lasted for perhaps several seconds.   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  The labs are in the [place]  system.  His total cholesterol is 101 Triglyceride levels 52 HDL is 58 LDL is 38.   Comprehensive metabolic profile shows a glucose of 137.  The remaining labs and the comprehensive panel are normal. Hemoglobin A1c is 7.2.  September 22, 2021  [name] is seen today for follow-up of his congestive heart failure, diabetes mellitus, hypertension, hyperlipidemia.  Perhaps a bit more fatigue than usual No angina Some chest discomfort ,  occurs randomly , not necessarily withexercise  Exercising twice a week  Has not been sleeping well ,  More caffeine today    Labs from his endocrinologist at [facility] show Total cholesterol 111, triglyceride level is 56, HDL is 48, LDL is 50   Complete metabolic profile shows a glucose level of 141.  Creatinine is 1.19.  Sodium is 139.  Potassium is 4.8.  Oct. 30, 2023 [name] is seen for follow up of his chronic diastolic CHF,  Very busy, Is not eating well or drinking well   Echo May 2023  - normal LVEFmild DD  Cor CTA :    Coronary calcium score of 211. This was 95th percentile for age-, sex, and race-matched controls.   2. Normal coronary origin with right dominance.   3. Mild calcified plaque (25-49%) in the proximal LAD.   4. Moderate non-calcified plaque (50-69%) in the first diagonal.   5. Minimal plaque (<25%) in the LCX.   6. Minimal mixed density plaque in the RCA (<25%).    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     BAYER CONTOUR NEXT TEST test strip as directed.  0   carvedilol (COREG) 6.25 MG tablet Take 1 tablet (6.25 mg total) by mouth 2 (two) times daily. 180 tablet 3   colchicine 0.6 MG tablet Take 1 tablet by mouth as needed.     Continuous Blood Gluc Sensor (FREESTYLE LIBRE 14 DAY SENSOR) MISC 1 applicator by Other route daily.     insulin aspart (NOVOLOG FLEXPEN) 100 UNIT/ML FlexPen Inject 15 Units into the skin 3 (three) times daily.     meloxicam (MOBIC) 15 MG tablet Take 15 mg by mouth as needed.     ramipril (ALTACE) 5 MG capsule Take 1 capsule (5 mg total) by mouth daily. 90 capsule 3   sildenafil (REVATIO) 20 MG tablet TAKE 5 TABLETS BY MOUTH AS NEEDED FOR SEXUAL ACTIVITY 100 tablet 2   TOUJEO MAX SOLOSTAR 300 UNIT/ML SOPN Inject 10 Units into the skin daily.     TRULICITY 1.5 0000000 SOPN Inject 0.5 mLs into the skin once a week.     zolpidem (AMBIEN) 10 MG tablet Take 10 mg by mouth at  bedtime as needed for sleep. Reported on 07/13/2015     SYNJARDY XR 12.09-998 MG TB24 Take 1 tablet by mouth 2 (two) times daily.     No current facility-administered medications for  this visit.    Allergies:   Semaglutide    Social History:  The patient  reports that he has never smoked. He has never used smokeless tobacco. He reports current alcohol use. He reports that he does not use drugs.   Family History:  The patient's family history includes Breast cancer in his mother; Diabetes in his maternal grandmother and paternal grandmother; Heart attack in his father; Heart disease in his maternal grandfather; Hypertension in his sister, sister, and sister; Stroke in his maternal grandmother and paternal grandmother.    ROS:   Noted in current history, otherwise review of systems is negative.   Physical Exam: Blood pressure 118/72, pulse 76, height 5' 11.75" (1.822 m), weight 188 lb 6.4 oz (85.5 kg), SpO2 97 %.       GEN:  Well nourished, well developed in no acute distress HEENT: Normal NECK: No JVD; No carotid bruits LYMPHATICS: No lymphadenopathy CARDIAC: RRR , no murmurs, rubs, gallops RESPIRATORY:  Clear to auscultation without rales, wheezing or rhonchi  ABDOMEN: Soft, non-tender, non-distended MUSCULOSKELETAL:  No edema; No deformity  SKIN: Warm and dry NEUROLOGIC:  Alert and oriented x 3    EKG:       Recent Labs: 09/22/2021: BUN 19; Creatinine, Ser 1.24; Potassium 4.9; Sodium 142; TSH 3.900    Lipid Panel    Component Value Date/Time   CHOL 122 07/28/2019 0832   TRIG 72 07/28/2019 0832   HDL 67 07/28/2019 0832   CHOLHDL 1.8 07/28/2019 0832   CHOLHDL 3.7 11/25/2015 0839   VLDL 20 11/25/2015 0839   LDLCALC 40 07/28/2019 0832    Wt Readings from Last 3 Encounters:  03/27/22 188 lb 6.4 oz (85.5 kg)  09/22/21 189 lb 9.6 oz (86 kg)  08/24/20 189 lb (85.7 kg)     Other studies Reviewed: Additional studies/ records that were reviewed today include:  . Review of the above records demonstrates:    ASSESSMENT AND PLAN:    1.  Chronic diastolic congestive heart failure:   Advised regular exercise.  He should maintain his current body weight.  Needs to avoid eating excessive salty foods.  He is not volume overloaded.   2. Essential HTN.:       3.  Hyperlipidemia:   Managed by Dr. Kenton Kingfisher.  4.  Coronary artery disease: Had a coronary CT angiogram in May, 2023.  He has a coronary calcium score of 211.  He has mild, nonobstructive coronary artery disease.  He is not having any angina.  His last LDL was 40.  His lipids are managed by Dr. Kenton Kingfisher.   Current medicines are reviewed at length with the patient today.  The patient does not have concerns regarding medicines.  The following changes have been made:  no change  Labs/ tests ordered today include:   No orders of the defined types were placed in this encounter.   Disposition:   office visit in 1 year .       Mertie Moores, MD  03/27/2022 9:11 AM    [facility] [address], [place], [place]  [address] Phone: [phone]; Fax: [phone]

## 2022-03-27 ENCOUNTER — Ambulatory Visit: Payer: BC Managed Care – PPO | Attending: Cardiovascular Disease | Admitting: Cardiovascular Disease

## 2022-03-27 VITALS — BP 118/72 | HR 76 | Ht 71.75 in | Wt 188.4 lb

## 2022-03-27 DIAGNOSIS — I5032 Chronic diastolic (congestive) heart failure: Secondary | ICD-10-CM

## 2022-03-27 DIAGNOSIS — I2510 Atherosclerotic heart disease of native coronary artery without angina pectoris: Secondary | ICD-10-CM

## 2022-03-27 DIAGNOSIS — E782 Mixed hyperlipidemia: Secondary | ICD-10-CM | POA: Diagnosis not present

## 2022-03-27 NOTE — Patient Instructions (Signed)
Medication Instructions:  Your physician recommends that you continue on your current medications as directed. Please refer to the Current Medication list given to you today.  *If you need a refill on your cardiac medications before your next appointment, please call your pharmacy*   Lab Work: NONE If you have labs (blood work) drawn today and your tests are completely normal, you will receive your results only by: MyChart Message (if you have MyChart) OR A paper copy in the mail If you have any lab test that is abnormal or we need to change your treatment, we will call you to review the results.   Testing/Procedures: NONE   Follow-Up: At [facility], you and your health needs are our priority.  As part of our continuing mission to provide you with exceptional heart care, we have created designated Provider Care Teams.  These Care Teams include your primary Cardiologist (physician) and Advanced Practice Providers (APPs -  Physician Assistants and Nurse Practitioners) who all work together to provide you with the care you need, when you need it.  We recommend signing up for the patient portal called "MyChart".  Sign up information is provided on this After Visit Summary.  MyChart is used to connect with patients for Virtual Visits (Telemedicine).  Patients are able to view lab/test results, encounter notes, upcoming appointments, etc.  Non-urgent messages can be sent to your provider as well.   To learn more about what you can do with MyChart, go to [email or web address].    Your next appointment:   1 year(s)  The format for your next appointment:   In Person  Provider:   Philip Nahser, MD       Important Information About Sugar       

## 2022-05-17 DIAGNOSIS — R6889 Other general symptoms and signs: Secondary | ICD-10-CM | POA: Diagnosis not present

## 2022-06-27 ENCOUNTER — Other Ambulatory Visit (HOSPITAL_COMMUNITY): Payer: Self-pay

## 2022-06-27 MED ORDER — TRULICITY 3 MG/0.5ML ~~LOC~~ SOAJ
3.0000 mg | SUBCUTANEOUS | 4 refills | Status: DC
  Filled 2022-06-27: qty 2, 28d supply, fill #0
  Filled 2022-08-09: qty 2, 28d supply, fill #1

## 2022-07-11 DIAGNOSIS — E1129 Type 2 diabetes mellitus with other diabetic kidney complication: Secondary | ICD-10-CM | POA: Diagnosis not present

## 2022-07-11 DIAGNOSIS — R809 Proteinuria, unspecified: Secondary | ICD-10-CM | POA: Diagnosis not present

## 2022-07-11 DIAGNOSIS — Z1330 Encounter for screening examination for mental health and behavioral disorders, unspecified: Secondary | ICD-10-CM | POA: Diagnosis not present

## 2022-07-11 DIAGNOSIS — Z794 Long term (current) use of insulin: Secondary | ICD-10-CM | POA: Diagnosis not present

## 2022-07-11 DIAGNOSIS — I152 Hypertension secondary to endocrine disorders: Secondary | ICD-10-CM | POA: Diagnosis not present

## 2022-07-11 DIAGNOSIS — E1159 Type 2 diabetes mellitus with other circulatory complications: Secondary | ICD-10-CM | POA: Diagnosis not present

## 2022-08-11 ENCOUNTER — Other Ambulatory Visit (HOSPITAL_COMMUNITY): Payer: Self-pay

## 2022-08-11 MED ORDER — TRULICITY 3 MG/0.5ML ~~LOC~~ SOAJ
3.0000 mg | SUBCUTANEOUS | 1 refills | Status: AC
  Filled 2022-08-11 – 2022-08-18 (×3): qty 6, 84d supply, fill #0
  Filled 2022-11-29: qty 2, 28d supply, fill #0
  Filled 2023-03-18: qty 2, 28d supply, fill #1
  Filled 2023-04-13 – 2023-08-07 (×5): qty 2, 28d supply, fill #2

## 2022-08-15 ENCOUNTER — Other Ambulatory Visit (HOSPITAL_COMMUNITY): Payer: Self-pay

## 2022-08-17 ENCOUNTER — Other Ambulatory Visit (HOSPITAL_COMMUNITY): Payer: Self-pay

## 2022-08-17 ENCOUNTER — Other Ambulatory Visit: Payer: Self-pay

## 2022-08-18 ENCOUNTER — Other Ambulatory Visit (HOSPITAL_COMMUNITY): Payer: Self-pay

## 2022-08-23 ENCOUNTER — Other Ambulatory Visit (HOSPITAL_COMMUNITY): Payer: Self-pay

## 2022-08-24 ENCOUNTER — Other Ambulatory Visit: Payer: Self-pay

## 2022-11-15 ENCOUNTER — Other Ambulatory Visit: Payer: Self-pay | Admitting: Cardiovascular Disease

## 2022-11-16 DIAGNOSIS — I152 Hypertension secondary to endocrine disorders: Secondary | ICD-10-CM | POA: Diagnosis not present

## 2022-11-16 DIAGNOSIS — E1169 Type 2 diabetes mellitus with other specified complication: Secondary | ICD-10-CM | POA: Diagnosis not present

## 2022-11-16 DIAGNOSIS — E785 Hyperlipidemia, unspecified: Secondary | ICD-10-CM | POA: Diagnosis not present

## 2022-11-16 DIAGNOSIS — E1129 Type 2 diabetes mellitus with other diabetic kidney complication: Secondary | ICD-10-CM | POA: Diagnosis not present

## 2022-11-16 DIAGNOSIS — E1159 Type 2 diabetes mellitus with other circulatory complications: Secondary | ICD-10-CM | POA: Diagnosis not present

## 2022-11-16 DIAGNOSIS — R809 Proteinuria, unspecified: Secondary | ICD-10-CM | POA: Diagnosis not present

## 2022-11-16 DIAGNOSIS — Z794 Long term (current) use of insulin: Secondary | ICD-10-CM | POA: Diagnosis not present

## 2022-11-29 ENCOUNTER — Other Ambulatory Visit (HOSPITAL_COMMUNITY): Payer: Self-pay

## 2022-11-29 MED ORDER — FLUTICASONE PROPIONATE 50 MCG/ACT NA SUSP
1.0000 | Freq: Every day | NASAL | 0 refills | Status: DC
  Filled 2022-12-19 – 2023-01-16 (×2): qty 16, 60d supply, fill #0
  Filled 2023-01-24: qty 16, 30d supply, fill #0

## 2022-11-29 MED ORDER — INSULIN GLARGINE (2 UNIT DIAL) 300 UNIT/ML ~~LOC~~ SOPN
40.0000 [IU] | PEN_INJECTOR | Freq: Every day | SUBCUTANEOUS | 5 refills | Status: AC
  Filled 2022-12-19: qty 6, 45d supply, fill #0
  Filled 2023-01-30: qty 6, 45d supply, fill #1
  Filled 2023-03-18: qty 6, 45d supply, fill #2
  Filled 2023-05-04 – 2023-09-04 (×9): qty 6, 45d supply, fill #3

## 2022-11-29 MED ORDER — FREESTYLE LIBRE 3 SENSOR MISC
5 refills | Status: DC
  Filled 2022-11-29 – 2022-12-11 (×2): qty 2, 28d supply, fill #0

## 2022-11-29 MED ORDER — ATORVASTATIN CALCIUM 40 MG PO TABS
40.0000 mg | ORAL_TABLET | Freq: Every day | ORAL | 3 refills | Status: DC
  Filled 2022-11-29 – 2023-01-16 (×3): qty 90, 90d supply, fill #0
  Filled 2023-04-24: qty 90, 90d supply, fill #1
  Filled 2023-07-22: qty 90, 90d supply, fill #2

## 2022-11-29 MED ORDER — SYNJARDY XR 12.5-1000 MG PO TB24
1.0000 | ORAL_TABLET | Freq: Two times a day (BID) | ORAL | 3 refills | Status: AC
  Filled 2022-12-19: qty 180, 90d supply, fill #0
  Filled 2023-03-18: qty 180, 90d supply, fill #1
  Filled 2023-06-21: qty 180, 90d supply, fill #2

## 2022-11-29 MED ORDER — TRULICITY 3 MG/0.5ML ~~LOC~~ SOAJ
3.0000 mg | SUBCUTANEOUS | 1 refills | Status: DC
  Filled 2022-12-21: qty 6, 84d supply, fill #0

## 2022-11-29 MED ORDER — CARVEDILOL 6.25 MG PO TABS
6.2500 mg | ORAL_TABLET | Freq: Two times a day (BID) | ORAL | 4 refills | Status: DC
  Filled 2023-03-10 – 2023-04-13 (×4): qty 60, 30d supply, fill #0
  Filled 2023-05-20: qty 60, 30d supply, fill #1

## 2022-11-29 MED ORDER — RAMIPRIL 5 MG PO CAPS
5.0000 mg | ORAL_CAPSULE | Freq: Every day | ORAL | 3 refills | Status: DC
  Filled 2023-02-08 – 2023-02-13 (×2): qty 90, 90d supply, fill #0
  Filled 2023-05-20: qty 90, 90d supply, fill #1

## 2022-11-29 MED ORDER — INSULIN ASPART 100 UNIT/ML FLEXPEN
15.0000 [IU] | PEN_INJECTOR | Freq: Three times a day (TID) | SUBCUTANEOUS | 4 refills | Status: DC
  Filled 2022-12-21: qty 30, 60d supply, fill #0
  Filled 2023-01-24 – 2023-01-29 (×2): qty 30, 60d supply, fill #1
  Filled 2023-01-30: qty 30, 38d supply, fill #1

## 2022-12-11 ENCOUNTER — Other Ambulatory Visit (HOSPITAL_COMMUNITY): Payer: Self-pay

## 2022-12-19 ENCOUNTER — Other Ambulatory Visit (HOSPITAL_COMMUNITY): Payer: Self-pay

## 2022-12-19 ENCOUNTER — Other Ambulatory Visit: Payer: Self-pay

## 2022-12-21 ENCOUNTER — Other Ambulatory Visit (HOSPITAL_COMMUNITY): Payer: Self-pay

## 2023-01-15 DIAGNOSIS — H04123 Dry eye syndrome of bilateral lacrimal glands: Secondary | ICD-10-CM | POA: Diagnosis not present

## 2023-01-15 DIAGNOSIS — H53022 Refractive amblyopia, left eye: Secondary | ICD-10-CM | POA: Diagnosis not present

## 2023-01-15 DIAGNOSIS — H2513 Age-related nuclear cataract, bilateral: Secondary | ICD-10-CM | POA: Diagnosis not present

## 2023-01-15 DIAGNOSIS — H25013 Cortical age-related cataract, bilateral: Secondary | ICD-10-CM | POA: Diagnosis not present

## 2023-01-16 ENCOUNTER — Other Ambulatory Visit: Payer: Self-pay

## 2023-01-16 ENCOUNTER — Other Ambulatory Visit (HOSPITAL_BASED_OUTPATIENT_CLINIC_OR_DEPARTMENT_OTHER): Payer: Self-pay

## 2023-01-23 ENCOUNTER — Other Ambulatory Visit (HOSPITAL_BASED_OUTPATIENT_CLINIC_OR_DEPARTMENT_OTHER): Payer: Self-pay

## 2023-01-24 ENCOUNTER — Other Ambulatory Visit (HOSPITAL_BASED_OUTPATIENT_CLINIC_OR_DEPARTMENT_OTHER): Payer: Self-pay

## 2023-01-24 ENCOUNTER — Other Ambulatory Visit: Payer: Self-pay

## 2023-01-25 ENCOUNTER — Other Ambulatory Visit (HOSPITAL_BASED_OUTPATIENT_CLINIC_OR_DEPARTMENT_OTHER): Payer: Self-pay

## 2023-01-25 MED ORDER — FREESTYLE LIBRE 3 SENSOR MISC
1.0000 | 5 refills | Status: DC
  Filled 2023-01-25: qty 2, 28d supply, fill #0

## 2023-01-26 ENCOUNTER — Other Ambulatory Visit (HOSPITAL_BASED_OUTPATIENT_CLINIC_OR_DEPARTMENT_OTHER): Payer: Self-pay

## 2023-01-30 ENCOUNTER — Other Ambulatory Visit (HOSPITAL_COMMUNITY): Payer: Self-pay

## 2023-01-30 ENCOUNTER — Other Ambulatory Visit: Payer: Self-pay

## 2023-01-30 ENCOUNTER — Other Ambulatory Visit (HOSPITAL_BASED_OUTPATIENT_CLINIC_OR_DEPARTMENT_OTHER): Payer: Self-pay

## 2023-01-31 ENCOUNTER — Other Ambulatory Visit (HOSPITAL_BASED_OUTPATIENT_CLINIC_OR_DEPARTMENT_OTHER): Payer: Self-pay

## 2023-02-05 ENCOUNTER — Other Ambulatory Visit (HOSPITAL_BASED_OUTPATIENT_CLINIC_OR_DEPARTMENT_OTHER): Payer: Self-pay

## 2023-02-08 ENCOUNTER — Other Ambulatory Visit (HOSPITAL_COMMUNITY): Payer: Self-pay

## 2023-02-09 ENCOUNTER — Other Ambulatory Visit (HOSPITAL_BASED_OUTPATIENT_CLINIC_OR_DEPARTMENT_OTHER): Payer: Self-pay

## 2023-02-13 ENCOUNTER — Other Ambulatory Visit (HOSPITAL_COMMUNITY): Payer: Self-pay

## 2023-02-13 ENCOUNTER — Other Ambulatory Visit (HOSPITAL_BASED_OUTPATIENT_CLINIC_OR_DEPARTMENT_OTHER): Payer: Self-pay

## 2023-02-13 MED ORDER — DEXCOM G7 SENSOR MISC
5 refills | Status: DC
  Filled 2023-02-13: qty 3, 30d supply, fill #0
  Filled 2023-03-10 – 2023-03-13 (×3): qty 3, 30d supply, fill #1
  Filled 2023-04-09 – 2023-06-01 (×8): qty 3, 30d supply, fill #2
  Filled 2023-08-07: qty 3, 30d supply, fill #3
  Filled 2023-12-13: qty 3, 30d supply, fill #4
  Filled 2024-01-09: qty 3, 30d supply, fill #5

## 2023-02-26 ENCOUNTER — Other Ambulatory Visit (HOSPITAL_BASED_OUTPATIENT_CLINIC_OR_DEPARTMENT_OTHER): Payer: Self-pay

## 2023-03-05 ENCOUNTER — Other Ambulatory Visit (HOSPITAL_BASED_OUTPATIENT_CLINIC_OR_DEPARTMENT_OTHER): Payer: Self-pay

## 2023-03-10 ENCOUNTER — Other Ambulatory Visit (HOSPITAL_COMMUNITY): Payer: Self-pay

## 2023-03-10 ENCOUNTER — Other Ambulatory Visit (HOSPITAL_BASED_OUTPATIENT_CLINIC_OR_DEPARTMENT_OTHER): Payer: Self-pay

## 2023-03-12 ENCOUNTER — Other Ambulatory Visit: Payer: Self-pay

## 2023-03-12 ENCOUNTER — Other Ambulatory Visit (HOSPITAL_BASED_OUTPATIENT_CLINIC_OR_DEPARTMENT_OTHER): Payer: Self-pay

## 2023-03-12 ENCOUNTER — Other Ambulatory Visit (HOSPITAL_COMMUNITY): Payer: Self-pay

## 2023-03-12 MED ORDER — NOVOLOG FLEXPEN 100 UNIT/ML ~~LOC~~ SOPN
15.0000 [IU] | PEN_INJECTOR | Freq: Three times a day (TID) | SUBCUTANEOUS | 4 refills | Status: AC
  Filled 2023-03-12: qty 30, 37d supply, fill #0
  Filled 2023-04-14 – 2023-06-21 (×3): qty 30, 37d supply, fill #1

## 2023-03-13 ENCOUNTER — Other Ambulatory Visit (HOSPITAL_COMMUNITY): Payer: Self-pay

## 2023-03-13 ENCOUNTER — Other Ambulatory Visit (HOSPITAL_BASED_OUTPATIENT_CLINIC_OR_DEPARTMENT_OTHER): Payer: Self-pay

## 2023-03-18 ENCOUNTER — Other Ambulatory Visit (HOSPITAL_BASED_OUTPATIENT_CLINIC_OR_DEPARTMENT_OTHER): Payer: Self-pay

## 2023-03-19 ENCOUNTER — Other Ambulatory Visit (HOSPITAL_BASED_OUTPATIENT_CLINIC_OR_DEPARTMENT_OTHER): Payer: Self-pay

## 2023-03-19 ENCOUNTER — Other Ambulatory Visit: Payer: Self-pay

## 2023-03-19 MED ORDER — FLUTICASONE PROPIONATE 50 MCG/ACT NA SUSP
1.0000 | Freq: Every day | NASAL | 0 refills | Status: DC
  Filled 2023-03-19: qty 16, 30d supply, fill #0

## 2023-03-20 ENCOUNTER — Other Ambulatory Visit: Payer: Self-pay

## 2023-03-20 ENCOUNTER — Other Ambulatory Visit (HOSPITAL_BASED_OUTPATIENT_CLINIC_OR_DEPARTMENT_OTHER): Payer: Self-pay

## 2023-03-20 DIAGNOSIS — I152 Hypertension secondary to endocrine disorders: Secondary | ICD-10-CM | POA: Diagnosis not present

## 2023-03-20 DIAGNOSIS — E785 Hyperlipidemia, unspecified: Secondary | ICD-10-CM | POA: Diagnosis not present

## 2023-03-20 DIAGNOSIS — Z794 Long term (current) use of insulin: Secondary | ICD-10-CM | POA: Diagnosis not present

## 2023-03-20 DIAGNOSIS — E1159 Type 2 diabetes mellitus with other circulatory complications: Secondary | ICD-10-CM | POA: Diagnosis not present

## 2023-03-20 DIAGNOSIS — R809 Proteinuria, unspecified: Secondary | ICD-10-CM | POA: Diagnosis not present

## 2023-03-20 DIAGNOSIS — E1169 Type 2 diabetes mellitus with other specified complication: Secondary | ICD-10-CM | POA: Diagnosis not present

## 2023-03-20 DIAGNOSIS — E1129 Type 2 diabetes mellitus with other diabetic kidney complication: Secondary | ICD-10-CM | POA: Diagnosis not present

## 2023-03-20 MED ORDER — TOUJEO MAX SOLOSTAR 300 UNIT/ML ~~LOC~~ SOPN
45.0000 [IU] | PEN_INJECTOR | Freq: Every day | SUBCUTANEOUS | 3 refills | Status: DC
  Filled 2023-03-20: qty 6, 40d supply, fill #0
  Filled 2023-04-09 – 2023-04-14 (×2): qty 18, 120d supply, fill #0
  Filled 2023-04-24: qty 12, 80d supply, fill #0
  Filled 2023-07-11: qty 12, 80d supply, fill #1
  Filled 2023-10-15: qty 12, 80d supply, fill #2
  Filled 2024-01-09: qty 12, 80d supply, fill #3

## 2023-03-20 MED ORDER — TRULICITY 3 MG/0.5ML ~~LOC~~ SOAJ
3.0000 mg | SUBCUTANEOUS | 3 refills | Status: AC
  Filled 2023-03-20 – 2023-04-19 (×3): qty 6, 84d supply, fill #0
  Filled 2023-04-24: qty 2, 28d supply, fill #0
  Filled 2023-04-26 – 2023-04-27 (×2): qty 6, 84d supply, fill #0
  Filled 2023-07-11: qty 6, 84d supply, fill #1

## 2023-03-20 MED ORDER — FIASP FLEXTOUCH 100 UNIT/ML ~~LOC~~ SOPN
15.0000 [IU] | PEN_INJECTOR | Freq: Three times a day (TID) | SUBCUTANEOUS | 3 refills | Status: AC
  Filled 2023-03-20: qty 60, 75d supply, fill #0
  Filled 2023-06-07: qty 60, 75d supply, fill #1

## 2023-03-22 ENCOUNTER — Other Ambulatory Visit (HOSPITAL_BASED_OUTPATIENT_CLINIC_OR_DEPARTMENT_OTHER): Payer: Self-pay

## 2023-03-22 ENCOUNTER — Other Ambulatory Visit (HOSPITAL_COMMUNITY): Payer: Self-pay

## 2023-03-22 DIAGNOSIS — I10 Essential (primary) hypertension: Secondary | ICD-10-CM | POA: Diagnosis not present

## 2023-03-22 DIAGNOSIS — Z23 Encounter for immunization: Secondary | ICD-10-CM | POA: Diagnosis not present

## 2023-03-22 DIAGNOSIS — Z0000 Encounter for general adult medical examination without abnormal findings: Secondary | ICD-10-CM | POA: Diagnosis not present

## 2023-03-22 DIAGNOSIS — E113292 Type 2 diabetes mellitus with mild nonproliferative diabetic retinopathy without macular edema, left eye: Secondary | ICD-10-CM | POA: Diagnosis not present

## 2023-03-22 DIAGNOSIS — Z125 Encounter for screening for malignant neoplasm of prostate: Secondary | ICD-10-CM | POA: Diagnosis not present

## 2023-03-22 DIAGNOSIS — E7800 Pure hypercholesterolemia, unspecified: Secondary | ICD-10-CM | POA: Diagnosis not present

## 2023-03-22 MED ORDER — ZOLPIDEM TARTRATE 10 MG PO TABS
10.0000 mg | ORAL_TABLET | Freq: Every evening | ORAL | 1 refills | Status: DC | PRN
  Filled 2023-03-22: qty 90, 90d supply, fill #0
  Filled 2023-08-07: qty 90, 90d supply, fill #1

## 2023-03-22 MED ORDER — TAMSULOSIN HCL 0.4 MG PO CAPS
0.4000 mg | ORAL_CAPSULE | Freq: Every evening | ORAL | 1 refills | Status: DC
  Filled 2023-03-22: qty 90, 90d supply, fill #0
  Filled 2023-06-21: qty 90, 90d supply, fill #1

## 2023-04-09 ENCOUNTER — Other Ambulatory Visit (HOSPITAL_BASED_OUTPATIENT_CLINIC_OR_DEPARTMENT_OTHER): Payer: Self-pay

## 2023-04-10 ENCOUNTER — Other Ambulatory Visit (HOSPITAL_BASED_OUTPATIENT_CLINIC_OR_DEPARTMENT_OTHER): Payer: Self-pay

## 2023-04-11 ENCOUNTER — Other Ambulatory Visit (HOSPITAL_BASED_OUTPATIENT_CLINIC_OR_DEPARTMENT_OTHER): Payer: Self-pay

## 2023-04-13 ENCOUNTER — Other Ambulatory Visit (HOSPITAL_BASED_OUTPATIENT_CLINIC_OR_DEPARTMENT_OTHER): Payer: Self-pay

## 2023-04-14 ENCOUNTER — Other Ambulatory Visit (HOSPITAL_BASED_OUTPATIENT_CLINIC_OR_DEPARTMENT_OTHER): Payer: Self-pay

## 2023-04-16 ENCOUNTER — Other Ambulatory Visit (HOSPITAL_BASED_OUTPATIENT_CLINIC_OR_DEPARTMENT_OTHER): Payer: Self-pay

## 2023-04-19 ENCOUNTER — Other Ambulatory Visit (HOSPITAL_BASED_OUTPATIENT_CLINIC_OR_DEPARTMENT_OTHER): Payer: Self-pay

## 2023-04-24 ENCOUNTER — Other Ambulatory Visit (HOSPITAL_BASED_OUTPATIENT_CLINIC_OR_DEPARTMENT_OTHER): Payer: Self-pay

## 2023-04-24 ENCOUNTER — Other Ambulatory Visit: Payer: Self-pay

## 2023-04-24 MED ORDER — FLUTICASONE PROPIONATE 50 MCG/ACT NA SUSP
1.0000 | Freq: Every day | NASAL | 0 refills | Status: DC
  Filled 2023-04-24: qty 16, 30d supply, fill #0

## 2023-04-27 ENCOUNTER — Other Ambulatory Visit: Payer: Self-pay

## 2023-04-27 ENCOUNTER — Other Ambulatory Visit (HOSPITAL_BASED_OUTPATIENT_CLINIC_OR_DEPARTMENT_OTHER): Payer: Self-pay

## 2023-04-28 ENCOUNTER — Other Ambulatory Visit (HOSPITAL_BASED_OUTPATIENT_CLINIC_OR_DEPARTMENT_OTHER): Payer: Self-pay

## 2023-05-02 ENCOUNTER — Other Ambulatory Visit (HOSPITAL_BASED_OUTPATIENT_CLINIC_OR_DEPARTMENT_OTHER): Payer: Self-pay

## 2023-05-04 ENCOUNTER — Other Ambulatory Visit: Payer: Self-pay

## 2023-05-04 ENCOUNTER — Other Ambulatory Visit (HOSPITAL_BASED_OUTPATIENT_CLINIC_OR_DEPARTMENT_OTHER): Payer: Self-pay

## 2023-05-09 ENCOUNTER — Other Ambulatory Visit (HOSPITAL_BASED_OUTPATIENT_CLINIC_OR_DEPARTMENT_OTHER): Payer: Self-pay

## 2023-05-10 ENCOUNTER — Other Ambulatory Visit (HOSPITAL_BASED_OUTPATIENT_CLINIC_OR_DEPARTMENT_OTHER): Payer: Self-pay

## 2023-05-10 MED ORDER — DEXCOM G7 SENSOR MISC
5 refills | Status: DC
  Filled 2023-05-10: qty 3, 30d supply, fill #0
  Filled 2023-07-11: qty 3, 30d supply, fill #1
  Filled 2023-09-04: qty 3, 30d supply, fill #2
  Filled 2023-10-15 – 2023-10-16 (×2): qty 3, 30d supply, fill #3
  Filled 2023-11-17: qty 3, 30d supply, fill #4

## 2023-05-20 ENCOUNTER — Other Ambulatory Visit (HOSPITAL_BASED_OUTPATIENT_CLINIC_OR_DEPARTMENT_OTHER): Payer: Self-pay

## 2023-05-21 ENCOUNTER — Other Ambulatory Visit: Payer: Self-pay

## 2023-05-22 ENCOUNTER — Other Ambulatory Visit: Payer: Self-pay

## 2023-05-28 ENCOUNTER — Other Ambulatory Visit (HOSPITAL_BASED_OUTPATIENT_CLINIC_OR_DEPARTMENT_OTHER): Payer: Self-pay

## 2023-06-01 ENCOUNTER — Other Ambulatory Visit: Payer: Self-pay

## 2023-06-07 ENCOUNTER — Other Ambulatory Visit (HOSPITAL_BASED_OUTPATIENT_CLINIC_OR_DEPARTMENT_OTHER): Payer: Self-pay

## 2023-06-07 ENCOUNTER — Other Ambulatory Visit: Payer: Self-pay

## 2023-06-07 MED ORDER — FLUTICASONE PROPIONATE 50 MCG/ACT NA SUSP
1.0000 | Freq: Every day | NASAL | 0 refills | Status: DC
  Filled 2023-06-07: qty 16, 60d supply, fill #0

## 2023-06-08 ENCOUNTER — Other Ambulatory Visit: Payer: Self-pay

## 2023-06-11 ENCOUNTER — Other Ambulatory Visit (HOSPITAL_BASED_OUTPATIENT_CLINIC_OR_DEPARTMENT_OTHER): Payer: Self-pay

## 2023-06-12 ENCOUNTER — Other Ambulatory Visit: Payer: Self-pay

## 2023-06-13 ENCOUNTER — Other Ambulatory Visit (HOSPITAL_BASED_OUTPATIENT_CLINIC_OR_DEPARTMENT_OTHER): Payer: Self-pay

## 2023-06-13 MED ORDER — FIASP FLEXTOUCH 100 UNIT/ML ~~LOC~~ SOPN
15.0000 [IU] | PEN_INJECTOR | Freq: Three times a day (TID) | SUBCUTANEOUS | 5 refills | Status: AC
  Filled 2023-06-13: qty 15, 25d supply, fill #0

## 2023-06-18 ENCOUNTER — Other Ambulatory Visit (HOSPITAL_BASED_OUTPATIENT_CLINIC_OR_DEPARTMENT_OTHER): Payer: Self-pay

## 2023-06-21 ENCOUNTER — Other Ambulatory Visit: Payer: Self-pay

## 2023-06-21 ENCOUNTER — Other Ambulatory Visit: Payer: Self-pay | Admitting: Cardiovascular Disease

## 2023-06-22 ENCOUNTER — Other Ambulatory Visit (HOSPITAL_BASED_OUTPATIENT_CLINIC_OR_DEPARTMENT_OTHER): Payer: Self-pay

## 2023-06-22 ENCOUNTER — Other Ambulatory Visit: Payer: Self-pay

## 2023-06-22 MED ORDER — CARVEDILOL 6.25 MG PO TABS
6.2500 mg | ORAL_TABLET | Freq: Two times a day (BID) | ORAL | 0 refills | Status: DC
Start: 2023-06-22 — End: 2023-07-22
  Filled 2023-06-22: qty 60, 30d supply, fill #0

## 2023-07-11 ENCOUNTER — Other Ambulatory Visit: Payer: Self-pay

## 2023-07-11 ENCOUNTER — Other Ambulatory Visit (HOSPITAL_BASED_OUTPATIENT_CLINIC_OR_DEPARTMENT_OTHER): Payer: Self-pay

## 2023-07-11 MED ORDER — DULAGLUTIDE 3 MG/0.5ML ~~LOC~~ SOAJ
3.0000 mg | SUBCUTANEOUS | 1 refills | Status: AC
  Filled 2023-07-11: qty 2, 28d supply, fill #0
  Filled 2023-09-04 – 2023-09-12 (×2): qty 6, 84d supply, fill #0

## 2023-07-18 ENCOUNTER — Other Ambulatory Visit (HOSPITAL_BASED_OUTPATIENT_CLINIC_OR_DEPARTMENT_OTHER): Payer: Self-pay

## 2023-07-22 ENCOUNTER — Other Ambulatory Visit: Payer: Self-pay | Admitting: Cardiovascular Disease

## 2023-07-23 ENCOUNTER — Other Ambulatory Visit: Payer: Self-pay

## 2023-07-23 ENCOUNTER — Other Ambulatory Visit (HOSPITAL_BASED_OUTPATIENT_CLINIC_OR_DEPARTMENT_OTHER): Payer: Self-pay

## 2023-07-23 MED ORDER — CARVEDILOL 6.25 MG PO TABS
6.2500 mg | ORAL_TABLET | Freq: Two times a day (BID) | ORAL | 0 refills | Status: DC
Start: 2023-07-23 — End: 2023-08-07
  Filled 2023-07-23: qty 30, 15d supply, fill #0

## 2023-08-07 ENCOUNTER — Other Ambulatory Visit (HOSPITAL_BASED_OUTPATIENT_CLINIC_OR_DEPARTMENT_OTHER): Payer: Self-pay

## 2023-08-07 ENCOUNTER — Other Ambulatory Visit: Payer: Self-pay

## 2023-08-07 ENCOUNTER — Other Ambulatory Visit: Payer: Self-pay | Admitting: Cardiovascular Disease

## 2023-08-07 MED ORDER — CARVEDILOL 6.25 MG PO TABS
6.2500 mg | ORAL_TABLET | Freq: Two times a day (BID) | ORAL | 0 refills | Status: DC
  Filled 2023-08-07: qty 30, 15d supply, fill #0

## 2023-08-07 MED ORDER — FLUTICASONE PROPIONATE 50 MCG/ACT NA SUSP
1.0000 | Freq: Every day | NASAL | 0 refills | Status: DC
Start: 2023-08-07 — End: 2023-10-17
  Filled 2023-08-07: qty 16, 60d supply, fill #0

## 2023-09-01 ENCOUNTER — Other Ambulatory Visit (HOSPITAL_BASED_OUTPATIENT_CLINIC_OR_DEPARTMENT_OTHER): Payer: Self-pay

## 2023-09-04 ENCOUNTER — Other Ambulatory Visit (HOSPITAL_BASED_OUTPATIENT_CLINIC_OR_DEPARTMENT_OTHER): Payer: Self-pay

## 2023-09-04 ENCOUNTER — Other Ambulatory Visit: Payer: Self-pay | Admitting: Cardiovascular Disease

## 2023-09-04 ENCOUNTER — Other Ambulatory Visit: Payer: Self-pay

## 2023-09-04 MED ORDER — RAMIPRIL 5 MG PO CAPS
5.0000 mg | ORAL_CAPSULE | Freq: Every day | ORAL | 3 refills | Status: AC
  Filled 2023-09-04: qty 90, 90d supply, fill #0
  Filled 2023-12-13: qty 90, 90d supply, fill #1
  Filled 2024-03-08: qty 90, 90d supply, fill #2
  Filled 2024-06-17: qty 90, 90d supply, fill #3

## 2023-09-06 ENCOUNTER — Other Ambulatory Visit (HOSPITAL_BASED_OUTPATIENT_CLINIC_OR_DEPARTMENT_OTHER): Payer: Self-pay

## 2023-09-06 ENCOUNTER — Encounter (HOSPITAL_BASED_OUTPATIENT_CLINIC_OR_DEPARTMENT_OTHER): Payer: Self-pay

## 2023-09-12 ENCOUNTER — Other Ambulatory Visit (HOSPITAL_BASED_OUTPATIENT_CLINIC_OR_DEPARTMENT_OTHER): Payer: Self-pay

## 2023-09-13 ENCOUNTER — Other Ambulatory Visit: Payer: Self-pay

## 2023-09-13 ENCOUNTER — Other Ambulatory Visit (HOSPITAL_BASED_OUTPATIENT_CLINIC_OR_DEPARTMENT_OTHER): Payer: Self-pay

## 2023-09-13 MED ORDER — LYUMJEV KWIKPEN 100 UNIT/ML ~~LOC~~ SOPN
PEN_INJECTOR | SUBCUTANEOUS | 3 refills | Status: AC
  Filled 2023-09-13: qty 75, 90d supply, fill #0
  Filled 2023-12-13: qty 75, 90d supply, fill #1
  Filled 2024-03-21: qty 75, 90d supply, fill #2
  Filled 2024-06-17: qty 75, 90d supply, fill #3

## 2023-09-13 MED ORDER — SYNJARDY XR 12.5-1000 MG PO TB24
1.0000 | ORAL_TABLET | Freq: Two times a day (BID) | ORAL | 3 refills | Status: AC
  Filled 2023-09-13: qty 180, 90d supply, fill #0
  Filled 2023-12-17: qty 180, 90d supply, fill #1
  Filled 2024-03-21: qty 180, 90d supply, fill #2
  Filled 2024-06-17: qty 180, 90d supply, fill #3

## 2023-09-13 MED ORDER — TRULICITY 4.5 MG/0.5ML ~~LOC~~ SOAJ
4.5000 mg | SUBCUTANEOUS | 3 refills | Status: AC
  Filled 2023-09-13 – 2023-10-15 (×2): qty 6, 84d supply, fill #0
  Filled 2023-10-17 – 2023-11-17 (×2): qty 2, 28d supply, fill #0
  Filled 2023-12-13: qty 2, 28d supply, fill #1
  Filled 2024-01-12: qty 2, 28d supply, fill #2
  Filled 2024-02-10: qty 2, 28d supply, fill #3
  Filled 2024-03-08: qty 2, 28d supply, fill #4
  Filled 2024-04-24: qty 2, 28d supply, fill #5
  Filled 2024-05-22: qty 2, 28d supply, fill #6
  Filled 2024-06-29: qty 2, 28d supply, fill #7

## 2023-09-21 ENCOUNTER — Other Ambulatory Visit (HOSPITAL_BASED_OUTPATIENT_CLINIC_OR_DEPARTMENT_OTHER): Payer: Self-pay

## 2023-10-15 ENCOUNTER — Other Ambulatory Visit (HOSPITAL_BASED_OUTPATIENT_CLINIC_OR_DEPARTMENT_OTHER): Payer: Self-pay

## 2023-10-15 ENCOUNTER — Other Ambulatory Visit: Payer: Self-pay | Admitting: Cardiovascular Disease

## 2023-10-16 ENCOUNTER — Other Ambulatory Visit: Payer: Self-pay

## 2023-10-16 ENCOUNTER — Other Ambulatory Visit (HOSPITAL_BASED_OUTPATIENT_CLINIC_OR_DEPARTMENT_OTHER): Payer: Self-pay

## 2023-10-17 ENCOUNTER — Encounter (HOSPITAL_BASED_OUTPATIENT_CLINIC_OR_DEPARTMENT_OTHER): Payer: Self-pay

## 2023-10-17 ENCOUNTER — Other Ambulatory Visit (HOSPITAL_BASED_OUTPATIENT_CLINIC_OR_DEPARTMENT_OTHER): Payer: Self-pay

## 2023-10-17 MED ORDER — FLUTICASONE PROPIONATE 50 MCG/ACT NA SUSP
1.0000 | Freq: Every day | NASAL | 0 refills | Status: DC
  Filled 2023-10-17: qty 16, 60d supply, fill #0

## 2023-10-17 MED ORDER — TAMSULOSIN HCL 0.4 MG PO CAPS
0.4000 mg | ORAL_CAPSULE | Freq: Every evening | ORAL | 1 refills | Status: DC
  Filled 2023-10-17: qty 90, 90d supply, fill #0
  Filled 2024-01-12: qty 90, 90d supply, fill #1

## 2023-10-17 NOTE — Telephone Encounter (Signed)
 Pt of Dr Floria Hurst. Last seen on 03/27/22. Passed his 3rd attempt. Does Dr. Alroy Aspen want to refill? Please advise

## 2023-11-17 ENCOUNTER — Other Ambulatory Visit (HOSPITAL_BASED_OUTPATIENT_CLINIC_OR_DEPARTMENT_OTHER): Payer: Self-pay

## 2023-11-19 ENCOUNTER — Other Ambulatory Visit: Payer: Self-pay

## 2023-11-19 ENCOUNTER — Other Ambulatory Visit (HOSPITAL_BASED_OUTPATIENT_CLINIC_OR_DEPARTMENT_OTHER): Payer: Self-pay

## 2023-11-19 MED ORDER — ATORVASTATIN CALCIUM 40 MG PO TABS
40.0000 mg | ORAL_TABLET | Freq: Every day | ORAL | 1 refills | Status: DC
  Filled 2023-11-19: qty 90, 90d supply, fill #0

## 2023-11-19 MED ORDER — ZOLPIDEM TARTRATE 10 MG PO TABS
10.0000 mg | ORAL_TABLET | Freq: Every evening | ORAL | 1 refills | Status: AC | PRN
  Filled 2023-11-19: qty 90, 90d supply, fill #0
  Filled 2024-03-21: qty 90, 90d supply, fill #1

## 2023-11-21 NOTE — Progress Notes (Unsigned)
 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 male with a PMH of HFpEF, HLD, HTN, DM type I, family history of CAD who presents today for annual follow-up.  [name] has been followed by Dr. Passe since 2017 when he was seen for complaint of chest pain.  He completed a Lexiscan  Myoview  that showed decreased LV function of 44% and 2D echo was completed that showed EF of 60 to 65% with mild posterior wall severe septal hypertrophy.  He was started on GDMT and completed repeat 2D echo in 2018 that showed improved EF function.  He completed an exercise Myoview  for complaint of shortness of breath that showed no ischemia with normal heart function.  He was seen in follow-up on 09/22/2021 and endorsed some chest discomfort with fatigue.  He completed a coronary CTA that showed calcium  score of 211 with moderate calcium  first diagonal (50-69%) and mild calcified plaque in the proximal LAD (25-49%) with FFR completed showing no flow-limiting lesions in first diagonal.  [name] presents today for annual follow up. He feels generally good regarding his cardiac health, with some fatigue attributed to aging and diabetes. Fatigue occurs throughout the day but is not activity-specific. He remains active, playing golf and working out three times a week. No extreme shortness  of breath, swelling, issues with lying flat, or sleep disturbances are reported. He previously had borderline sleep apnea, which resolved with weight loss, and he no longer uses a CPAP machine. He has diabetes and notes elevated blood sugars, which he believes contribute to his fatigue. His last A1c was 7.2. He is on a sliding scale for insulin  and feels more tired when blood sugars are elevated. He is under the care of an endocrinologist and is considering finding a new endocrinologist. No current chest pain, palpitations, or significant shortness of breath during activities. He is on carvedilol  and ramipril  for his heart condition, and atorvastatin  for cholesterol management. He also takes Synjardy  for diabetes management. He reports rare palpitations, usually in the morning or at bedtime, but they are short-lived and not bothersome. He has a family history of heart disease, which he acknowledges as a risk factor. He maintains a healthy lifestyle with regular exercise and dietary management, including salt restrictions. No neuropathic symptoms related to his diabetes. Patient denies chest pain, palpitations, dyspnea, PND, orthopnea, nausea, vomiting, dizziness, syncope, edema, weight gain, or early satiety.  Discussed the use of AI scribe software for clinical note transcription with the patient, who gave verbal consent to proceed.  History of Present Illness   Review of Systems  Please see the history of present illness.    All other systems reviewed and are otherwise negative except as noted above.  Physical Exam    Wt Readings from Last 3 Encounters:  03/27/22 188 lb 6.4 oz (85.5 kg)  09/22/21 189 lb 9.6 oz (86 kg)  08/24/20 189 lb (85.7 kg)   CD:Uyzmz were no vitals filed for this visit.,There is no height or weight on file to calculate BMI. GEN: Well nourished, well developed in no acute distress Neck: No JVD; No carotid bruits Pulmonary: Clear to auscultation without rales, wheezing or  rhonchi  Cardiovascular: Normal rate. Regular rhythm. Normal S1. Normal S2.   Murmurs: There is no murmur.  ABDOMEN: Soft, non-tender, non-distended EXTREMITIES:  No edema; No deformity   EKG/LABS/ Recent Cardiac Studies   ECG personally reviewed by me today -sinus rhythm with rate of 67 bpm and no acute changes consistent with previous EKG.  Risk Assessment/Calculations:          Lab Results  Component Value Date   WBC 4.9 11/22/2017   HGB 15.1 11/22/2017   HCT 43.7 11/22/2017   MCV 91 11/22/2017   PLT 267 11/22/2017   Lab Results  Component Value Date   CREATININE 1.24 09/22/2021   BUN 19 09/22/2021   NA 142 09/22/2021   K 4.9 09/22/2021   CL 99 09/22/2021   CO2 23 09/22/2021   Lab Results  Component Value Date   CHOL 122 07/28/2019   HDL 67 07/28/2019   LDLCALC 40 07/28/2019   TRIG 72 07/28/2019   CHOLHDL 1.8 07/28/2019    Lab Results  Component Value Date   HGBA1C 8.4 08/03/2017   Assessment & Plan    Assessment and Plan Assessment & Plan Heart failure with reduced ejection fraction Heart function well-managed with guideline-directed therapy. Asymptomatic with fatigue likely due to diabetes. - Order echocardiogram to assess heart function and wall motion. - Refill carvedilol  prescription.  Coronary artery disease with calcification Moderate calcification with non-flow-limiting plaque. LDL at 73, improvement needed. Discussed high-intensity statin therapy benefits. - Increase atorvastatin  to 80 mg for high-intensity statin therapy. - Start aspirin 81 mg daily. - Order echocardiogram to monitor heart function.  Hyperlipidemia LDL at 73, further reduction desired to lower cardiovascular risk. - Increase atorvastatin  to 80 mg for high-intensity statin therapy.  Type 2 diabetes mellitus with hyperglycemia A1c at 7.2, slightly above goal. Fatigue likely due to elevated blood sugars. Managed by endocrinologist, considering change due to dissatisfaction. -  Continue current diabetes management with endocrinologist. - Consider referral to [facility] endocrinology for a second opinion.  1.  HFpEF: eart function well-managed with guideline-directed therapy. Asymptomatic with fatigue likely due to diabetes. - Order echocardiogram to assess heart function and wall motion. - Continue current GDMT with carvedilol  6.25 mg twice daily ramipril  5 mg daily Synjardy  XR as prescribed 12.09-998 mg -Low sodium diet, fluid restriction <2L, and daily weights encouraged. Educated to contact our office for weight gain of 2 lbs overnight or 5 lbs in one week.   2.  Essential HTN: - Patient's blood pressure today was stable at 114/74 - Continue ramipril  5 mg daily 6.25 mg twice daily  3.  Coronary artery disease: -Moderate calcification with non-flow-limiting plaque. LDL at 73, improvement needed. Discussed high-intensity statin therapy benefits. - Increase atorvastatin  to 80 mg for high-intensity statin therapy. - Start aspirin 81 mg daily. - Order echocardiogram to monitor heart function.  4.  HLD: - Patient's last LDL cholesterol was 73 Further reduction desired to lower cardiovascular risk. - Increase atorvastatin  to 80 mg for high-intensity statin therapy.  5. Type 1 diabetes mellitus with hyperglycemia A1c at 7.2, slightly above goal. Fatigue likely due to  elevated blood sugars. Managed by endocrinologist, considering change due to dissatisfaction. - Continue current diabetes management with endocrinologist. - Consider referral to [facility] endocrinology for a second opinion.    Disposition: Follow-up with Dr. Floretta or APP in 6 months    Signed, Wyn Raddle, Jackee Shove, NP 11/21/2023, 2:14 PM [facility]

## 2023-11-22 ENCOUNTER — Other Ambulatory Visit (HOSPITAL_BASED_OUTPATIENT_CLINIC_OR_DEPARTMENT_OTHER): Payer: Self-pay

## 2023-11-22 ENCOUNTER — Ambulatory Visit: Attending: Nurse Practitioner | Admitting: Nurse Practitioner

## 2023-11-22 ENCOUNTER — Encounter: Payer: Self-pay | Admitting: Nurse Practitioner

## 2023-11-22 VITALS — BP 114/74 | HR 71 | Ht 71.0 in | Wt 191.0 lb

## 2023-11-22 DIAGNOSIS — I5032 Chronic diastolic (congestive) heart failure: Secondary | ICD-10-CM

## 2023-11-22 DIAGNOSIS — I10 Essential (primary) hypertension: Secondary | ICD-10-CM

## 2023-11-22 DIAGNOSIS — E782 Mixed hyperlipidemia: Secondary | ICD-10-CM

## 2023-11-22 DIAGNOSIS — I2510 Atherosclerotic heart disease of native coronary artery without angina pectoris: Secondary | ICD-10-CM

## 2023-11-22 DIAGNOSIS — E1029 Type 1 diabetes mellitus with other diabetic kidney complication: Secondary | ICD-10-CM

## 2023-11-22 MED ORDER — ATORVASTATIN CALCIUM 80 MG PO TABS
80.0000 mg | ORAL_TABLET | Freq: Every day | ORAL | 1 refills | Status: DC
  Filled 2023-11-22: qty 90, 90d supply, fill #0
  Filled 2024-02-17: qty 90, 90d supply, fill #1

## 2023-11-22 MED ORDER — ASPIRIN 81 MG PO TBEC: 81.0000 mg | DELAYED_RELEASE_TABLET | Freq: Every day | ORAL | Status: AC

## 2023-11-22 MED ORDER — CARVEDILOL 6.25 MG PO TABS
6.2500 mg | ORAL_TABLET | Freq: Two times a day (BID) | ORAL | 1 refills | Status: DC
  Filled 2023-11-22: qty 180, 90d supply, fill #0
  Filled 2024-02-17: qty 180, 90d supply, fill #1

## 2023-11-22 NOTE — Patient Instructions (Addendum)
 Medication Instructions:  INCREASE Lipitor 80mg  Take 1 tablet once a day  START Aspirin 81mg  Take 1 tablet once a day  *If you need a refill on your cardiac medications before your next appointment, please call your pharmacy*  Lab Work: 8 WEEKS FASTING LABS LIPIDS & LFTS (12/28/2023) If you have labs (blood work) drawn today and your tests are completely normal, you will receive your results only by: MyChart Message (if you have MyChart) OR A paper copy in the mail If you have any lab test that is abnormal or we need to change your treatment, we will call you to review the results.  Testing/Procedures: Your physician has requested that you have an echocardiogram. Echocardiography is a painless test that uses sound waves to create images of your heart. It provides your doctor with information about the size and shape of your heart and how well your heart's chambers and valves are working. This procedure takes approximately one hour. There are no restrictions for this procedure. Please do NOT wear cologne, perfume, aftershave, or lotions (deodorant is allowed). Please arrive 15 minutes prior to your appointment time.  Please note: We ask at that you not bring children with you during ultrasound (echo/ vascular) testing. Due to room size and safety concerns, children are not allowed in the ultrasound rooms during exams. Our front office staff cannot provide observation of children in our lobby area while testing is being conducted. An adult accompanying a patient to their appointment will only be allowed in the ultrasound room at the discretion of the ultrasound technician under special circumstances. We apologize for any inconvenience.  Follow-Up: At [facility], you and your health needs are our priority.  As part of our continuing mission to provide you with exceptional heart care, our providers are all part of one team.  This team includes your primary Cardiologist (physician) and  Advanced Practice Providers or APPs (Physician Assistants and Nurse Practitioners) who all work together to provide you with the care you need, when you need it.  Your next appointment:   6 month(s)  Provider:   Georganna Archer, MD    We recommend signing up for the patient portal called MyChart.  Sign up information is provided on this After Visit Summary.  MyChart is used to connect with patients for Virtual Visits (Telemedicine).  Patients are able to view lab/test results, encounter notes, upcoming appointments, etc.  Non-urgent messages can be sent to your provider as well.   To learn more about what you can do with MyChart, go to [email or web address].   Other Instructions

## 2023-12-03 ENCOUNTER — Other Ambulatory Visit (HOSPITAL_COMMUNITY): Payer: Self-pay

## 2023-12-03 ENCOUNTER — Other Ambulatory Visit: Payer: Self-pay

## 2023-12-03 ENCOUNTER — Other Ambulatory Visit (HOSPITAL_BASED_OUTPATIENT_CLINIC_OR_DEPARTMENT_OTHER): Payer: Self-pay

## 2023-12-03 DIAGNOSIS — I5032 Chronic diastolic (congestive) heart failure: Secondary | ICD-10-CM

## 2023-12-03 DIAGNOSIS — I2510 Atherosclerotic heart disease of native coronary artery without angina pectoris: Secondary | ICD-10-CM

## 2023-12-03 DIAGNOSIS — E782 Mixed hyperlipidemia: Secondary | ICD-10-CM

## 2023-12-03 DIAGNOSIS — I10 Essential (primary) hypertension: Secondary | ICD-10-CM

## 2023-12-10 ENCOUNTER — Other Ambulatory Visit (HOSPITAL_BASED_OUTPATIENT_CLINIC_OR_DEPARTMENT_OTHER): Payer: Self-pay

## 2023-12-10 MED ORDER — ALBUTEROL SULFATE HFA 108 (90 BASE) MCG/ACT IN AERS
2.0000 | INHALATION_SPRAY | Freq: Four times a day (QID) | RESPIRATORY_TRACT | 1 refills | Status: DC | PRN
  Filled 2023-12-10: qty 8.5, 28d supply, fill #0
  Filled 2024-03-01: qty 6.7, 23d supply, fill #1
  Filled 2024-04-24: qty 8.5, fill #2

## 2023-12-13 ENCOUNTER — Other Ambulatory Visit (HOSPITAL_BASED_OUTPATIENT_CLINIC_OR_DEPARTMENT_OTHER): Payer: Self-pay

## 2023-12-13 ENCOUNTER — Other Ambulatory Visit: Payer: Self-pay

## 2023-12-15 ENCOUNTER — Other Ambulatory Visit (HOSPITAL_BASED_OUTPATIENT_CLINIC_OR_DEPARTMENT_OTHER): Payer: Self-pay

## 2023-12-15 MED ORDER — FLUTICASONE PROPIONATE 50 MCG/ACT NA SUSP
1.0000 | Freq: Every day | NASAL | 0 refills | Status: DC
  Filled 2023-12-15: qty 16, 60d supply, fill #0

## 2023-12-17 ENCOUNTER — Other Ambulatory Visit: Payer: Self-pay

## 2024-01-07 ENCOUNTER — Ambulatory Visit (HOSPITAL_COMMUNITY)
Admission: RE | Admit: 2024-01-07 | Discharge: 2024-01-07 | Disposition: A | Discharge status: Home / Self Care | Source: Ambulatory Visit | Attending: Nurse Practitioner | Admitting: Nurse Practitioner

## 2024-01-07 ENCOUNTER — Ambulatory Visit: Payer: Self-pay | Admitting: General Practice

## 2024-01-07 DIAGNOSIS — E782 Mixed hyperlipidemia: Secondary | ICD-10-CM | POA: Insufficient documentation

## 2024-01-07 DIAGNOSIS — I5032 Chronic diastolic (congestive) heart failure: Secondary | ICD-10-CM | POA: Insufficient documentation

## 2024-01-07 DIAGNOSIS — I2510 Atherosclerotic heart disease of native coronary artery without angina pectoris: Secondary | ICD-10-CM | POA: Diagnosis not present

## 2024-01-07 DIAGNOSIS — I10 Essential (primary) hypertension: Secondary | ICD-10-CM | POA: Insufficient documentation

## 2024-01-07 LAB — ECHOCARDIOGRAM COMPLETE
AR max vel: 2.76 cm2
AV Area VTI: 2.8 cm2
AV Area mean vel: 2.69 cm2
AV Mean grad: 3 mmHg
AV Peak grad: 5 mmHg
Ao pk vel: 1.12 m/s
Area-P 1/2: 3.83 cm2
S' Lateral: 2.6 cm

## 2024-01-09 ENCOUNTER — Other Ambulatory Visit (HOSPITAL_BASED_OUTPATIENT_CLINIC_OR_DEPARTMENT_OTHER): Payer: Self-pay

## 2024-01-15 NOTE — Telephone Encounter (Signed)
 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 He wants to know if there is anything he needs to do or if additional testing is needed?  He also states he is having shortness of breath and dizziness hours after he exerts his self. He said his pcp told him it may be asthma and he gave him a inhaler. He states he just wanted to make us  aware because he does not want to have a stress test.   I advised the patient that I would speak with Jackee and get back to him. He voiced understanding.

## 2024-02-06 ENCOUNTER — Other Ambulatory Visit: Payer: Self-pay | Admitting: Family Medicine

## 2024-02-06 DIAGNOSIS — K7689 Other specified diseases of liver: Secondary | ICD-10-CM

## 2024-02-10 ENCOUNTER — Other Ambulatory Visit (HOSPITAL_BASED_OUTPATIENT_CLINIC_OR_DEPARTMENT_OTHER): Payer: Self-pay

## 2024-02-11 ENCOUNTER — Other Ambulatory Visit: Payer: Self-pay

## 2024-02-12 ENCOUNTER — Other Ambulatory Visit (HOSPITAL_BASED_OUTPATIENT_CLINIC_OR_DEPARTMENT_OTHER): Payer: Self-pay

## 2024-02-12 MED ORDER — FLUTICASONE PROPIONATE 50 MCG/ACT NA SUSP
1.0000 | Freq: Every day | NASAL | 0 refills | Status: DC
  Filled 2024-02-12: qty 16, 60d supply, fill #0

## 2024-02-13 ENCOUNTER — Ambulatory Visit
Admission: RE | Admit: 2024-02-13 | Discharge: 2024-02-13 | Disposition: A | Discharge status: Home / Self Care | Source: Ambulatory Visit | Attending: Family Medicine | Admitting: Family Medicine

## 2024-02-13 DIAGNOSIS — K7689 Other specified diseases of liver: Secondary | ICD-10-CM

## 2024-02-29 ENCOUNTER — Other Ambulatory Visit: Payer: Self-pay | Admitting: Family Medicine

## 2024-02-29 DIAGNOSIS — K838 Other specified diseases of biliary tract: Secondary | ICD-10-CM

## 2024-03-01 ENCOUNTER — Other Ambulatory Visit (HOSPITAL_BASED_OUTPATIENT_CLINIC_OR_DEPARTMENT_OTHER): Payer: Self-pay

## 2024-03-03 ENCOUNTER — Other Ambulatory Visit: Payer: Self-pay

## 2024-03-03 ENCOUNTER — Other Ambulatory Visit (HOSPITAL_BASED_OUTPATIENT_CLINIC_OR_DEPARTMENT_OTHER): Payer: Self-pay

## 2024-03-03 MED ORDER — DEXCOM G7 SENSOR MISC
5 refills | Status: AC
  Filled 2024-03-03: qty 3, 30d supply, fill #0
  Filled 2024-04-01: qty 3, 30d supply, fill #1
  Filled 2024-04-28: qty 3, 30d supply, fill #2
  Filled 2024-06-03: qty 3, 30d supply, fill #3
  Filled 2024-06-29: qty 3, 30d supply, fill #4

## 2024-03-11 ENCOUNTER — Ambulatory Visit
Admission: RE | Admit: 2024-03-11 | Discharge: 2024-03-11 | Disposition: A | Discharge status: Home / Self Care | Source: Ambulatory Visit | Attending: Family Medicine | Admitting: Family Medicine

## 2024-03-11 DIAGNOSIS — K838 Other specified diseases of biliary tract: Secondary | ICD-10-CM

## 2024-03-11 MED ORDER — GADOPICLENOL 0.5 MMOL/ML IV SOLN: 9.0000 mL | Freq: Once | INTRAVENOUS | Status: DC | PRN

## 2024-03-24 ENCOUNTER — Other Ambulatory Visit: Payer: Self-pay

## 2024-03-29 ENCOUNTER — Other Ambulatory Visit (HOSPITAL_BASED_OUTPATIENT_CLINIC_OR_DEPARTMENT_OTHER): Payer: Self-pay

## 2024-03-31 ENCOUNTER — Other Ambulatory Visit (HOSPITAL_BASED_OUTPATIENT_CLINIC_OR_DEPARTMENT_OTHER): Payer: Self-pay

## 2024-03-31 MED ORDER — TOUJEO MAX SOLOSTAR 300 UNIT/ML ~~LOC~~ SOPN
45.0000 [IU] | PEN_INJECTOR | Freq: Every day | SUBCUTANEOUS | 3 refills | Status: AC
  Filled 2024-03-31: qty 6, 40d supply, fill #0
  Filled 2024-05-22: qty 6, 40d supply, fill #1
  Filled ????-??-??: fill #2

## 2024-04-01 ENCOUNTER — Other Ambulatory Visit (HOSPITAL_BASED_OUTPATIENT_CLINIC_OR_DEPARTMENT_OTHER): Payer: Self-pay

## 2024-04-08 ENCOUNTER — Other Ambulatory Visit (HOSPITAL_BASED_OUTPATIENT_CLINIC_OR_DEPARTMENT_OTHER): Payer: Self-pay

## 2024-04-08 ENCOUNTER — Other Ambulatory Visit (HOSPITAL_COMMUNITY): Payer: Self-pay

## 2024-04-10 DIAGNOSIS — E113293 Type 2 diabetes mellitus with mild nonproliferative diabetic retinopathy without macular edema, bilateral: Secondary | ICD-10-CM | POA: Diagnosis not present

## 2024-04-10 DIAGNOSIS — H04123 Dry eye syndrome of bilateral lacrimal glands: Secondary | ICD-10-CM | POA: Diagnosis not present

## 2024-04-10 DIAGNOSIS — H53022 Refractive amblyopia, left eye: Secondary | ICD-10-CM | POA: Diagnosis not present

## 2024-04-10 DIAGNOSIS — H40023 Open angle with borderline findings, high risk, bilateral: Secondary | ICD-10-CM | POA: Diagnosis not present

## 2024-04-10 DIAGNOSIS — H2513 Age-related nuclear cataract, bilateral: Secondary | ICD-10-CM | POA: Diagnosis not present

## 2024-04-14 ENCOUNTER — Other Ambulatory Visit (HOSPITAL_BASED_OUTPATIENT_CLINIC_OR_DEPARTMENT_OTHER): Payer: Self-pay

## 2024-04-17 DIAGNOSIS — E113292 Type 2 diabetes mellitus with mild nonproliferative diabetic retinopathy without macular edema, left eye: Secondary | ICD-10-CM | POA: Diagnosis not present

## 2024-04-17 DIAGNOSIS — I10 Essential (primary) hypertension: Secondary | ICD-10-CM | POA: Diagnosis not present

## 2024-04-17 DIAGNOSIS — Z0000 Encounter for general adult medical examination without abnormal findings: Secondary | ICD-10-CM | POA: Diagnosis not present

## 2024-04-17 DIAGNOSIS — E7800 Pure hypercholesterolemia, unspecified: Secondary | ICD-10-CM | POA: Diagnosis not present

## 2024-04-17 DIAGNOSIS — N401 Enlarged prostate with lower urinary tract symptoms: Secondary | ICD-10-CM | POA: Diagnosis not present

## 2024-04-17 DIAGNOSIS — F5101 Primary insomnia: Secondary | ICD-10-CM | POA: Diagnosis not present

## 2024-04-17 DIAGNOSIS — Z23 Encounter for immunization: Secondary | ICD-10-CM | POA: Diagnosis not present

## 2024-04-17 DIAGNOSIS — Z125 Encounter for screening for malignant neoplasm of prostate: Secondary | ICD-10-CM | POA: Diagnosis not present

## 2024-04-18 ENCOUNTER — Other Ambulatory Visit (HOSPITAL_BASED_OUTPATIENT_CLINIC_OR_DEPARTMENT_OTHER): Payer: Self-pay

## 2024-04-24 ENCOUNTER — Other Ambulatory Visit (HOSPITAL_BASED_OUTPATIENT_CLINIC_OR_DEPARTMENT_OTHER): Payer: Self-pay

## 2024-04-25 ENCOUNTER — Other Ambulatory Visit (HOSPITAL_BASED_OUTPATIENT_CLINIC_OR_DEPARTMENT_OTHER): Payer: Self-pay

## 2024-04-28 ENCOUNTER — Other Ambulatory Visit (HOSPITAL_BASED_OUTPATIENT_CLINIC_OR_DEPARTMENT_OTHER): Payer: Self-pay

## 2024-04-28 ENCOUNTER — Other Ambulatory Visit: Payer: Self-pay

## 2024-04-28 MED ORDER — ALBUTEROL SULFATE HFA 108 (90 BASE) MCG/ACT IN AERS
2.0000 | INHALATION_SPRAY | Freq: Four times a day (QID) | RESPIRATORY_TRACT | 0 refills | Status: AC | PRN
  Filled 2024-04-28: qty 6.7, 25d supply, fill #0

## 2024-04-28 MED ORDER — TAMSULOSIN HCL 0.4 MG PO CAPS
0.4000 mg | ORAL_CAPSULE | Freq: Every evening | ORAL | 0 refills | Status: AC
  Filled 2024-04-28: qty 90, 90d supply, fill #0

## 2024-05-04 NOTE — Progress Notes (Signed)
 Cardiology Office Note:   Date:  05/04/2024  ID:  [name], DOB [birth year], MRN [id number] PCP: Arloa Elsie SAUNDERS, MD  [facility] Providers Cardiologist:  None { Chief Complaint: No chief complaint on file.     History of Present Illness:   [name] is a 58 y.o. male with a PMH of HFpEF, nonobstructive CAD, HLD, HTN, DM1 who presents for follow up ***.   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  Myovue scan with no significant ischemia or scar  LVEF 46%  Intermediate risk study   ECHOCARDIOGRAM  ECHOCARDIOGRAM COMPLETE 01/07/2024  Narrative ECHOCARDIOGRAM REPORT    Patient Name:   [name] Date of Exam: 01/07/2024 Medical Rec #:  [id number]     Height:       71.0 in Accession #:    [id number]    Weight:       191.0 lb Date of Birth:  1965-10-14     BSA:          2.068 m Patient Age:    58 years      BP:           125/81 mmHg Patient Gender: M             HR:           68 bpm. Exam Location:  [address]  Procedure: 2D Echo, Cardiac Doppler and Color Doppler (Both Spectral and Color Flow  Doppler were utilized during procedure).  Indications:    Chronic diastolic Heart Failure I50.32  History:        Patient has prior history of Echocardiogram examinations. Chronic diastolic Heart Failure, CAD; Risk Factors:Dyslipidemia, Hypertension and Diabetes.  Sonographer:    Sharlet Hamilton RDCS Referring Phys: [phone] JACKEE DEL, JR DICK   Sonographer Comments: Calcific structures noted in the liver causing shadowing artifact near the heart. IMPRESSIONS   1. Left ventricular ejection fraction, by estimation, is 55 to 60%. The left ventricle has normal function. The left ventricle has no regional wall motion abnormalities. There is moderate asymmetric left ventricular hypertrophy of the septal segment. Left ventricular diastolic parameters were normal. 2. Right ventricular systolic function is normal. The right ventricular size is normal. 3. The mitral valve is normal in structure. No evidence of mitral valve regurgitation. No evidence of mitral stenosis. 4. The aortic valve is tricuspid. Aortic valve regurgitation is not visualized. No aortic stenosis is present. 5. The inferior vena cava is normal in size with greater than 50% respiratory variability, suggesting right atrial pressure of 3 mmHg.  Comparison(s): No significant change from prior study.  FINDINGS Left Ventricle: Left ventricular  ejection fraction, by estimation, is 55 to 60%. The left ventricle has normal function. The left ventricle has no regional wall motion abnormalities. The left ventricular internal cavity size was normal in size. There is moderate asymmetric left ventricular hypertrophy of the septal segment. Left ventricular diastolic parameters were normal.  Right Ventricle: The right ventricular size is normal. No increase in right ventricular wall thickness. Right ventricular systolic function is normal.  Left Atrium: Left atrial size was normal in size.  Right Atrium: Right atrial size was normal in  size.  Pericardium: There is no evidence of pericardial effusion.  Mitral Valve: The mitral valve is normal in structure. No evidence of mitral valve regurgitation. No evidence of mitral valve stenosis.  Tricuspid Valve: The tricuspid valve is normal in structure. Tricuspid valve regurgitation is not demonstrated. No evidence of tricuspid stenosis.  Aortic Valve: The aortic valve is tricuspid. Aortic valve regurgitation is not visualized. No aortic stenosis is present. Aortic valve mean gradient measures 3.0 mmHg. Aortic valve peak gradient measures 5.0 mmHg. Aortic valve area, by VTI measures 2.80 cm.  Pulmonic Valve: The pulmonic valve was normal in structure. Pulmonic valve regurgitation is mild. No evidence of pulmonic stenosis.  Aorta: The aortic root is normal in size and structure.  Venous: The inferior vena cava is normal in size with greater than 50% respiratory variability, suggesting right atrial pressure of 3 mmHg.  IAS/Shunts: No atrial level shunt detected by color flow Doppler.   LEFT VENTRICLE PLAX 2D LVIDd:         4.10 cm   Diastology LVIDs:         2.60 cm   LV e' medial:    6.96 cm/s LV PW:         1.10 cm   LV E/e' medial:  9.7 LV IVS:        1.40 cm   LV e' lateral:   9.79 cm/s LVOT diam:     2.00 cm   LV E/e' lateral: 6.9 LV SV:         65 LV SV Index:   31 LVOT Area:     3.14 cm   RIGHT VENTRICLE RV Basal diam:  3.50 cm RV Mid diam:    3.00 cm RV S prime:     13.70 cm/s TAPSE (M-mode): 2.6 cm  LEFT ATRIUM             Index        RIGHT ATRIUM           Index LA diam:        3.40 cm 1.64 cm/m   RA Pressure: 3.00 mmHg LA Vol (A2C):   82.8 ml 40.04 ml/m  RA Area:     17.40 cm LA Vol (A4C):   43.8 ml 21.18 ml/m  RA Volume:   45.60 ml  22.05 ml/m LA Biplane Vol: 60.5 ml 29.26 ml/m AORTIC VALVE AV Area (Vmax):    2.76 cm AV Area (Vmean):   2.69 cm AV Area (VTI):     2.80 cm AV Vmax:           112.00 cm/s AV Vmean:          76.400 cm/s AV  VTI:            0.232 m AV Peak Grad:      5.0 mmHg AV Mean Grad:      3.0 mmHg LVOT Vmax:         98.50  cm/s LVOT Vmean:        65.500 cm/s LVOT VTI:          0.207 m LVOT/AV VTI ratio: 0.89  AORTA Ao Root diam: 3.40 cm Ao Asc diam:  3.20 cm  MITRAL VALVE               TRICUSPID VALVE MV Area (PHT): 3.83 cm    Estimated RAP:  3.00 mmHg MV Decel Time: 198 msec MV E velocity: 67.60 cm/s  SHUNTS MV A velocity: 64.50 cm/s  Systemic VTI:  0.21 m MV E/A ratio:  1.05        Systemic Diam: 2.00 cm  Morene Brownie Electronically signed by Morene Brownie Signature Date/Time: 01/07/2024/9:35:37 AM    Final      CT SCANS  CT CORONARY FRACTIONAL FLOW RESERVE DATA PREP 10/17/2021  Narrative EXAM: CT FFR analysis was performed on the original cardiac CTA dataset. Diagrammatic representation of the CT FFR analysis is provided in a separate PDF document in PACS. This dictation was created using the PDF document and an interactive 3D model of the results. The 3D model is not available in the EMR/PACS.  INTERPRETATION: CT FFR provides simultaneous calculation of pressure and flow across the entire coronary tree. For clinical decision making, CT FFR values should be obtained 1-2 cm distal to the lower border of each stenosis measured. Coronary CTA-related artifacts may impair the diagnostic accuracy of the original cardiac CTA and FFR CT results. *Due to the fact that CT FFR represents a mathematically-derived analysis, it is recommended that the results be interpreted as follows:  1. CT FFR >0.80: Low likelihood of hemodynamic significance. 2. CT FFR 0.76-0.80: Borderline likelihood of hemodynamic significance. 3. CT FFR =< 0.75: High likelihood of hemodynamic significance.  *Coronary CT Angiography-derived Fractional Flow Reserve Testing in Patients with Stable Coronary Artery Disease: Recommendations on Interpretation and Reporting. Radiology: Cardiothoracic  Imaging. 2019;1(5):e190050  FINDINGS: 1. Left Main: 0.99; low likelihood of hemodynamic significance.  2. Prox LAD: 0.95; low likelihood of hemodynamic significance. 3. Mid LAD 0.91; Low likelihood of hemodynamic significance. 4. First Diagonal: 0.85; Low likelihood of hemodynamic significance. 5. LCX: 0.90; low likelihood of hemodynamic significance. 6. RCA: 0.88; low likelihood of hemodynamic significance.  IMPRESSION:  1.  First diagonal negative by CT FFR.  Darryle Decent, MD   Electronically Signed By: Darryle Decent M.D. On: 10/17/2021 11:33   CT CORONARY MORPH W/CTA COR W/SCORE 10/17/2021  Addendum 10/17/2021 11:29 AM ADDENDUM REPORT: 10/17/2021 11:26  CLINICAL DATA:  Chest pain  EXAM: Cardiac/Coronary CTA  TECHNIQUE: A non-contrast, gated CT scan was obtained with axial slices of 3 mm through the heart for calcium  scoring. Calcium  scoring was performed using the Agatston method. A 90 kV prospective, gated, contrast cardiac scan was obtained. Gantry rotation speed was 250 msecs and collimation was 0.6 mm. Two sublingual nitroglycerin  tablets (0.8 mg) were given. The 3D data set was reconstructed in 5% intervals of the 35-75% of the R-R cycle. Diastolic phases were analyzed on a dedicated workstation using MPR, MIP, and VRT modes. The patient received 95 cc of contrast.  FINDINGS: Image quality: Excellent.  Noise artifact is: Limited.  Coronary Arteries:  Normal coronary origin.  Right dominance.  Left main: The left main is a large caliber vessel with a normal take off from the left coronary cusp that bifurcates to form a left anterior descending artery and a left circumflex artery. There is no plaque or stenosis.  Left anterior descending artery:  The proximal LAD contains mild calcified plaque (25-49%). The mid and distal segments are patent. The first diagonal contains moderate non-calcified plaque (50-69%).  Left circumflex artery: The LCX is  non-dominant. There is minimal non-calcified plaque (<25%). The LCX gives off 2 patent obtuse marginal branches.  Right coronary artery: The RCA is dominant with normal take off from the right coronary cusp. There is minimal mixed density plaque in the mid RCA (<25%). The RCA terminates as a PDA and right posterolateral branch without evidence of plaque or stenosis.  Right Atrium: Right atrial size is within normal limits.  Right Ventricle: The right ventricular cavity is within normal limits.  Left Atrium: Left atrial size is normal in size with no left atrial appendage filling defect.  Left Ventricle: The ventricular cavity size is within normal limits.  Pulmonary arteries: Normal in size without proximal filling defect.  Pulmonary veins: Normal pulmonary venous drainage.  Pericardium: Normal thickness without significant effusion or calcium  present.  Cardiac valves: The aortic valve is trileaflet without significant calcification. The mitral valve is normal without significant calcification.  Aorta: Normal caliber without significant disease.  Extra-cardiac findings: See attached radiology report for non-cardiac structures.  IMPRESSION: 1. Coronary calcium  score of 211. This was 95th percentile for age-, sex, and race-matched controls.  2. Normal coronary origin with right dominance.  3. Mild calcified plaque (25-49%) in the proximal LAD.  4. Moderate non-calcified plaque (50-69%) in the first diagonal.  5. Minimal plaque (<25%) in the LCX.  6. Minimal mixed density plaque in the RCA (<25%).  RECOMMENDATIONS: 1. Moderate stenosis in the first diagonal. Consider symptom-guided anti-ischemic pharmacotherapy as well as risk factor modification per guideline directed care. Additional analysis with CT FFR will be submitted.  Darryle Decent, MD   Electronically Signed By: Darryle Decent M.D. On: 10/17/2021 11:26  Narrative EXAM: OVER-READ INTERPRETATION  CT  CHEST  The following report is a limited chest CT over-read performed by radiologist Dr. Rockey Kilts of [facility] on 10/17/2021. This over-read does not include interpretation of cardiac or coronary anatomy or pathology. The coronary CTA interpretation by the cardiologist is attached.  COMPARISON:  Chest radiograph 06/02/2015  FINDINGS: Vascular: Normal aortic caliber. No central pulmonary embolism, on this non-dedicated study.  Mediastinum/Nodes: No imaged thoracic adenopathy.  Lungs/Pleura: No pleural fluid. Pleural-based right middle lobe 4 mm density on 32/11 is favored to represent a subpleural lymph node.  Upper Abdomen: Normal imaged portions of the liver, spleen, stomach.  Musculoskeletal: No acute osseous abnormality. Lower thoracic spondylosis.  IMPRESSION: No acute findings in the imaged extracardiac chest.  Probable subpleural lymph node in the right middle lobe at 4 mm. No follow-up needed if patient is low-risk. Non-contrast chest CT can be considered in 12 months if patient is high-risk. This recommendation follows the consensus statement: Guidelines for Management of Incidental Pulmonary Nodules Detected on CT Images: From the Fleischner Society 2017; Radiology 2017; 284:228-243.  Electronically Signed: By: Rockey Kilts M.D. On: 10/17/2021 10:11     ______________________________________________________________________________________________      Risk Assessment/Calculations:   {Does this patient have ATRIAL FIBRILLATION?:[phone]} No BP recorded.  {Refresh Note OR Click here to enter BP  :1}***        Physical Exam:     VS:  There were no vitals taken for this visit. ***    Wt Readings from Last 3 Encounters:  11/22/23 191 lb (86.6 kg)  03/27/22 188 lb 6.4 oz (85.5 kg)  09/22/21 189 lb 9.6  oz (86 kg)     GEN: Well nourished, well developed, in no acute distress NECK: No JVD; No carotid bruits CARDIAC: ***RRR, no murmurs,  rubs, gallops RESPIRATORY:  Clear to auscultation without rales, wheezing or rhonchi  ABDOMEN: Soft, non-tender, non-distended, normal bowel sounds EXTREMITIES:  Warm and well perfused, no edema; No deformity, 2+ radial pulses PSYCH: Normal mood and affect   Assessment & Plan       {Are you ordering a CV Procedure (e.g. stress test, cath, DCCV, TEE, etc)?   Press F2        :[id number]}   This note was written with the assistance of a dictation microphone or AI dictation software. Please excuse any typos or grammatical errors.   Signed, Georganna Archer, MD 05/04/2024 8:57 PM    [facility]

## 2024-05-05 ENCOUNTER — Encounter: Payer: Self-pay | Admitting: Student in an Organized Health Care Education/Training Program

## 2024-05-05 ENCOUNTER — Ambulatory Visit
Payer: Self-pay | Attending: Student in an Organized Health Care Education/Training Program | Admitting: Student in an Organized Health Care Education/Training Program

## 2024-05-05 VITALS — BP 124/72 | HR 78 | Ht 71.0 in | Wt 190.8 lb

## 2024-05-05 DIAGNOSIS — I5022 Chronic systolic (congestive) heart failure: Secondary | ICD-10-CM | POA: Diagnosis not present

## 2024-05-05 DIAGNOSIS — I2510 Atherosclerotic heart disease of native coronary artery without angina pectoris: Secondary | ICD-10-CM

## 2024-05-05 DIAGNOSIS — I10 Essential (primary) hypertension: Secondary | ICD-10-CM

## 2024-05-05 DIAGNOSIS — E782 Mixed hyperlipidemia: Secondary | ICD-10-CM | POA: Diagnosis not present

## 2024-05-05 DIAGNOSIS — I5020 Unspecified systolic (congestive) heart failure: Secondary | ICD-10-CM | POA: Insufficient documentation

## 2024-05-05 NOTE — Assessment & Plan Note (Signed)
-   Has nonobstructive CAD identified by CCTA.  No chest pain. Continue aspirin  81 mg daily

## 2024-05-05 NOTE — Assessment & Plan Note (Signed)
-   BP is at goal.  No changes. Continue Coreg  and ramipril

## 2024-05-05 NOTE — Assessment & Plan Note (Signed)
-   The patient has HFimpEF with his most recent echocardiogram showing normal systolic function. -He is asymptomatic and NYHA class I. -No changes. Continue Coreg  6.25 mg twice daily Continue ramipril  5 mg daily Continue Jardiance  12.5 mg daily Follow-up in 12 months

## 2024-05-05 NOTE — Assessment & Plan Note (Signed)
-   He states that he had a lipid panel recently checked by his PCP.  He will provide me with this information so that I can further counsel him on his cholesterol management. Follow-up PCP lipid panel results Continue atorvastatin  as prescribed

## 2024-05-07 ENCOUNTER — Encounter (HOSPITAL_COMMUNITY): Payer: Self-pay

## 2024-05-09 ENCOUNTER — Ambulatory Visit: Admitting: Student in an Organized Health Care Education/Training Program

## 2024-05-22 ENCOUNTER — Other Ambulatory Visit: Payer: Self-pay | Admitting: Nurse Practitioner

## 2024-05-22 DIAGNOSIS — I5022 Chronic systolic (congestive) heart failure: Secondary | ICD-10-CM

## 2024-05-22 DIAGNOSIS — E782 Mixed hyperlipidemia: Secondary | ICD-10-CM

## 2024-05-23 ENCOUNTER — Other Ambulatory Visit: Payer: Self-pay

## 2024-05-23 ENCOUNTER — Other Ambulatory Visit (HOSPITAL_BASED_OUTPATIENT_CLINIC_OR_DEPARTMENT_OTHER): Payer: Self-pay

## 2024-05-23 MED ORDER — ATORVASTATIN CALCIUM 80 MG PO TABS
80.0000 mg | ORAL_TABLET | Freq: Every day | ORAL | 1 refills | Status: AC
  Filled 2024-05-23: qty 90, 90d supply, fill #0

## 2024-05-23 MED ORDER — CARVEDILOL 6.25 MG PO TABS
6.2500 mg | ORAL_TABLET | Freq: Two times a day (BID) | ORAL | 1 refills | Status: AC
  Filled 2024-05-23: qty 180, 90d supply, fill #0

## 2024-06-03 ENCOUNTER — Other Ambulatory Visit (HOSPITAL_BASED_OUTPATIENT_CLINIC_OR_DEPARTMENT_OTHER): Payer: Self-pay

## 2024-06-03 ENCOUNTER — Other Ambulatory Visit: Payer: Self-pay

## 2024-06-04 ENCOUNTER — Other Ambulatory Visit (HOSPITAL_BASED_OUTPATIENT_CLINIC_OR_DEPARTMENT_OTHER): Payer: Self-pay

## 2024-06-04 MED ORDER — FLUTICASONE PROPIONATE 50 MCG/ACT NA SUSP
1.0000 | Freq: Every day | NASAL | 0 refills | Status: AC
  Filled 2024-06-04: qty 16, 30d supply, fill #0
  Filled 2024-06-17: qty 16, 60d supply, fill #0

## 2024-06-16 ENCOUNTER — Other Ambulatory Visit (HOSPITAL_BASED_OUTPATIENT_CLINIC_OR_DEPARTMENT_OTHER): Payer: Self-pay

## 2024-06-17 ENCOUNTER — Other Ambulatory Visit (HOSPITAL_BASED_OUTPATIENT_CLINIC_OR_DEPARTMENT_OTHER): Payer: Self-pay

## 2024-06-18 ENCOUNTER — Other Ambulatory Visit: Payer: Self-pay

## 2024-06-19 ENCOUNTER — Other Ambulatory Visit: Payer: Self-pay

## 2024-06-29 ENCOUNTER — Other Ambulatory Visit (HOSPITAL_BASED_OUTPATIENT_CLINIC_OR_DEPARTMENT_OTHER): Payer: Self-pay
